# Patient Record
Sex: Female | Born: 1955
Health system: Southern US, Community
[De-identification: ages and names within clinical notes are randomized; demographics above are authoritative.]

## PROBLEM LIST (undated history)

## (undated) DIAGNOSIS — M1990 Unspecified osteoarthritis, unspecified site: Secondary | ICD-10-CM

## (undated) DIAGNOSIS — D649 Anemia, unspecified: Secondary | ICD-10-CM

## (undated) DIAGNOSIS — D571 Sickle-cell disease without crisis: Secondary | ICD-10-CM

## (undated) DIAGNOSIS — T7840XA Allergy, unspecified, initial encounter: Secondary | ICD-10-CM

## (undated) HISTORY — PX: ABDOMINAL HYSTERECTOMY: SHX81

## (undated) HISTORY — DX: Anemia, unspecified: D64.9

## (undated) HISTORY — PX: FOOT SURGERY: SHX648

## (undated) HISTORY — PX: HAND SURGERY: SHX662

## (undated) HISTORY — DX: Allergy, unspecified, initial encounter: T78.40XA

## (undated) HISTORY — DX: Sickle-cell disease without crisis: D57.1

## (undated) HISTORY — DX: Unspecified osteoarthritis, unspecified site: M19.90

---

## 1998-03-21 ENCOUNTER — Encounter: Admission: RE | Admit: 1998-03-21 | Discharge: 1998-03-21 | Payer: Self-pay | Admitting: Family Medicine

## 1998-05-01 ENCOUNTER — Encounter: Admission: RE | Admit: 1998-05-01 | Discharge: 1998-05-01 | Payer: Self-pay | Admitting: Family Medicine

## 1998-07-17 ENCOUNTER — Encounter: Admission: RE | Admit: 1998-07-17 | Discharge: 1998-07-17 | Payer: Self-pay | Admitting: Family Medicine

## 1998-08-25 ENCOUNTER — Encounter: Admission: RE | Admit: 1998-08-25 | Discharge: 1998-08-25 | Payer: Self-pay | Admitting: Family Medicine

## 1998-10-02 ENCOUNTER — Encounter: Admission: RE | Admit: 1998-10-02 | Discharge: 1998-10-02 | Payer: Self-pay | Admitting: Family Medicine

## 1998-10-22 ENCOUNTER — Encounter: Admission: RE | Admit: 1998-10-22 | Discharge: 1998-10-22 | Payer: Self-pay | Admitting: Family Medicine

## 1999-02-17 ENCOUNTER — Encounter: Admission: RE | Admit: 1999-02-17 | Discharge: 1999-02-17 | Payer: Self-pay | Admitting: Family Medicine

## 1999-03-23 ENCOUNTER — Encounter: Admission: RE | Admit: 1999-03-23 | Discharge: 1999-03-23 | Payer: Self-pay | Admitting: Family Medicine

## 1999-04-08 ENCOUNTER — Encounter: Admission: RE | Admit: 1999-04-08 | Discharge: 1999-04-08 | Payer: Self-pay | Admitting: Family Medicine

## 1999-05-12 ENCOUNTER — Encounter: Admission: RE | Admit: 1999-05-12 | Discharge: 1999-05-12 | Payer: Self-pay | Admitting: Sports Medicine

## 1999-05-20 ENCOUNTER — Inpatient Hospital Stay (HOSPITAL_COMMUNITY): Admission: RE | Admit: 1999-05-20 | Discharge: 1999-05-22 | Payer: Self-pay | Admitting: Gynecology

## 1999-05-20 ENCOUNTER — Encounter (INDEPENDENT_AMBULATORY_CARE_PROVIDER_SITE_OTHER): Payer: Self-pay | Admitting: Specialist

## 1999-11-09 ENCOUNTER — Encounter: Admission: RE | Admit: 1999-11-09 | Discharge: 1999-11-09 | Payer: Self-pay | Admitting: Family Medicine

## 2000-02-02 ENCOUNTER — Other Ambulatory Visit: Admission: RE | Admit: 2000-02-02 | Discharge: 2000-02-02 | Payer: Self-pay | Admitting: Gynecology

## 2000-03-16 ENCOUNTER — Encounter: Admission: RE | Admit: 2000-03-16 | Discharge: 2000-03-16 | Payer: Self-pay | Admitting: Family Medicine

## 2000-04-07 ENCOUNTER — Ambulatory Visit (HOSPITAL_COMMUNITY): Admission: RE | Admit: 2000-04-07 | Discharge: 2000-04-07 | Payer: Self-pay | Admitting: *Deleted

## 2000-04-08 ENCOUNTER — Encounter: Payer: Self-pay | Admitting: Family Medicine

## 2000-04-14 ENCOUNTER — Encounter: Admission: RE | Admit: 2000-04-14 | Discharge: 2000-04-14 | Payer: Self-pay | Admitting: Family Medicine

## 2000-04-15 ENCOUNTER — Encounter: Payer: Self-pay | Admitting: Family Medicine

## 2000-04-15 ENCOUNTER — Encounter: Admission: RE | Admit: 2000-04-15 | Discharge: 2000-04-15 | Payer: Self-pay | Admitting: *Deleted

## 2000-05-06 ENCOUNTER — Encounter (INDEPENDENT_AMBULATORY_CARE_PROVIDER_SITE_OTHER): Payer: Self-pay | Admitting: Specialist

## 2000-05-06 ENCOUNTER — Ambulatory Visit (HOSPITAL_COMMUNITY): Admission: RE | Admit: 2000-05-06 | Discharge: 2000-05-06 | Payer: Self-pay | Admitting: General Surgery

## 2000-05-06 ENCOUNTER — Encounter: Payer: Self-pay | Admitting: General Surgery

## 2000-09-30 ENCOUNTER — Encounter: Admission: RE | Admit: 2000-09-30 | Discharge: 2000-09-30 | Payer: Self-pay | Admitting: Family Medicine

## 2000-11-18 ENCOUNTER — Encounter: Payer: Self-pay | Admitting: General Surgery

## 2000-11-18 ENCOUNTER — Ambulatory Visit (HOSPITAL_COMMUNITY): Admission: RE | Admit: 2000-11-18 | Discharge: 2000-11-18 | Payer: Self-pay | Admitting: General Surgery

## 2001-03-07 ENCOUNTER — Encounter: Admission: RE | Admit: 2001-03-07 | Discharge: 2001-03-07 | Payer: Self-pay | Admitting: Family Medicine

## 2001-03-07 ENCOUNTER — Ambulatory Visit (HOSPITAL_COMMUNITY): Admission: RE | Admit: 2001-03-07 | Discharge: 2001-03-07 | Payer: Self-pay | Admitting: Family Medicine

## 2001-03-15 ENCOUNTER — Encounter: Admission: RE | Admit: 2001-03-15 | Discharge: 2001-03-15 | Payer: Self-pay | Admitting: Family Medicine

## 2001-03-23 ENCOUNTER — Encounter: Admission: RE | Admit: 2001-03-23 | Discharge: 2001-03-23 | Payer: Self-pay | Admitting: Family Medicine

## 2001-10-02 ENCOUNTER — Other Ambulatory Visit: Admission: RE | Admit: 2001-10-02 | Discharge: 2001-10-02 | Payer: Self-pay | Admitting: Gynecology

## 2001-11-14 ENCOUNTER — Encounter: Admission: RE | Admit: 2001-11-14 | Discharge: 2001-11-14 | Payer: Self-pay | Admitting: Sports Medicine

## 2001-12-08 ENCOUNTER — Encounter: Payer: Self-pay | Admitting: General Surgery

## 2001-12-08 ENCOUNTER — Ambulatory Visit (HOSPITAL_COMMUNITY): Admission: RE | Admit: 2001-12-08 | Discharge: 2001-12-08 | Payer: Self-pay | Admitting: General Surgery

## 2002-06-14 ENCOUNTER — Encounter: Admission: RE | Admit: 2002-06-14 | Discharge: 2002-06-14 | Payer: Self-pay | Admitting: Family Medicine

## 2002-07-24 ENCOUNTER — Encounter: Admission: RE | Admit: 2002-07-24 | Discharge: 2002-07-24 | Payer: Self-pay | Admitting: Family Medicine

## 2002-12-14 ENCOUNTER — Encounter: Admission: RE | Admit: 2002-12-14 | Discharge: 2002-12-14 | Payer: Self-pay | Admitting: Family Medicine

## 2002-12-17 ENCOUNTER — Encounter: Admission: RE | Admit: 2002-12-17 | Discharge: 2002-12-17 | Payer: Self-pay | Admitting: Family Medicine

## 2003-02-15 ENCOUNTER — Other Ambulatory Visit: Admission: RE | Admit: 2003-02-15 | Discharge: 2003-02-15 | Payer: Self-pay | Admitting: Gynecology

## 2003-04-19 ENCOUNTER — Encounter: Admission: RE | Admit: 2003-04-19 | Discharge: 2003-04-19 | Payer: Self-pay | Admitting: Family Medicine

## 2003-12-30 ENCOUNTER — Encounter: Admission: RE | Admit: 2003-12-30 | Discharge: 2003-12-30 | Payer: Self-pay | Admitting: Family Medicine

## 2004-01-17 ENCOUNTER — Encounter: Admission: RE | Admit: 2004-01-17 | Discharge: 2004-01-17 | Payer: Self-pay | Admitting: Gynecology

## 2004-01-20 ENCOUNTER — Encounter: Admission: RE | Admit: 2004-01-20 | Discharge: 2004-01-20 | Payer: Self-pay | Admitting: Family Medicine

## 2004-04-21 ENCOUNTER — Encounter: Admission: RE | Admit: 2004-04-21 | Discharge: 2004-04-21 | Payer: Self-pay | Admitting: Sports Medicine

## 2004-05-22 ENCOUNTER — Other Ambulatory Visit: Admission: RE | Admit: 2004-05-22 | Discharge: 2004-05-22 | Payer: Self-pay | Admitting: Gynecology

## 2004-11-05 ENCOUNTER — Ambulatory Visit (HOSPITAL_COMMUNITY): Admission: RE | Admit: 2004-11-05 | Discharge: 2004-11-05 | Payer: Self-pay | Admitting: Orthopedic Surgery

## 2004-11-05 ENCOUNTER — Ambulatory Visit (HOSPITAL_BASED_OUTPATIENT_CLINIC_OR_DEPARTMENT_OTHER): Admission: RE | Admit: 2004-11-05 | Discharge: 2004-11-05 | Payer: Self-pay | Admitting: Orthopedic Surgery

## 2005-12-20 ENCOUNTER — Ambulatory Visit: Payer: Self-pay | Admitting: Sports Medicine

## 2006-01-05 ENCOUNTER — Ambulatory Visit: Payer: Self-pay | Admitting: Family Medicine

## 2006-12-16 ENCOUNTER — Ambulatory Visit: Payer: Self-pay | Admitting: Family Medicine

## 2006-12-16 ENCOUNTER — Encounter (INDEPENDENT_AMBULATORY_CARE_PROVIDER_SITE_OTHER): Payer: Self-pay | Admitting: *Deleted

## 2006-12-16 LAB — CONVERTED CEMR LAB
AST: 14 units/L (ref 0–37)
Alkaline Phosphatase: 66 units/L (ref 39–117)
BUN: 10 mg/dL (ref 6–23)
CO2: 28 meq/L (ref 19–32)
Chloride: 105 meq/L (ref 96–112)
Creatinine, Ser: 0.77 mg/dL (ref 0.40–1.20)
Glucose, Bld: 84 mg/dL (ref 70–99)
LDL Cholesterol: 118 mg/dL — ABNORMAL HIGH (ref 0–99)
TSH: 2.084 microintl units/mL (ref 0.350–5.50)
Total CHOL/HDL Ratio: 4.6

## 2006-12-26 ENCOUNTER — Encounter: Admission: RE | Admit: 2006-12-26 | Discharge: 2006-12-26 | Payer: Self-pay | Admitting: Sports Medicine

## 2006-12-28 ENCOUNTER — Encounter: Admission: RE | Admit: 2006-12-28 | Discharge: 2006-12-28 | Payer: Self-pay | Admitting: Sports Medicine

## 2006-12-30 ENCOUNTER — Encounter (INDEPENDENT_AMBULATORY_CARE_PROVIDER_SITE_OTHER): Payer: Self-pay | Admitting: *Deleted

## 2006-12-30 LAB — CONVERTED CEMR LAB

## 2007-01-02 ENCOUNTER — Ambulatory Visit: Payer: Self-pay | Admitting: Sports Medicine

## 2007-01-02 ENCOUNTER — Encounter (INDEPENDENT_AMBULATORY_CARE_PROVIDER_SITE_OTHER): Payer: Self-pay | Admitting: *Deleted

## 2007-01-26 DIAGNOSIS — K219 Gastro-esophageal reflux disease without esophagitis: Secondary | ICD-10-CM | POA: Insufficient documentation

## 2007-01-26 DIAGNOSIS — E049 Nontoxic goiter, unspecified: Secondary | ICD-10-CM | POA: Insufficient documentation

## 2007-01-26 HISTORY — DX: Nontoxic goiter, unspecified: E04.9

## 2007-01-27 ENCOUNTER — Encounter (INDEPENDENT_AMBULATORY_CARE_PROVIDER_SITE_OTHER): Payer: Self-pay | Admitting: *Deleted

## 2007-01-30 ENCOUNTER — Ambulatory Visit: Payer: Self-pay | Admitting: Family Medicine

## 2007-02-27 ENCOUNTER — Telehealth (INDEPENDENT_AMBULATORY_CARE_PROVIDER_SITE_OTHER): Payer: Self-pay | Admitting: *Deleted

## 2007-03-23 ENCOUNTER — Telehealth: Payer: Self-pay | Admitting: *Deleted

## 2007-03-24 ENCOUNTER — Ambulatory Visit: Payer: Self-pay | Admitting: Family Medicine

## 2007-08-08 ENCOUNTER — Telehealth: Payer: Self-pay | Admitting: *Deleted

## 2007-08-15 ENCOUNTER — Telehealth (INDEPENDENT_AMBULATORY_CARE_PROVIDER_SITE_OTHER): Payer: Self-pay | Admitting: *Deleted

## 2008-02-16 ENCOUNTER — Telehealth: Payer: Self-pay | Admitting: *Deleted

## 2008-03-26 ENCOUNTER — Telehealth: Payer: Self-pay | Admitting: *Deleted

## 2008-03-26 ENCOUNTER — Ambulatory Visit: Payer: Self-pay | Admitting: Family Medicine

## 2008-03-26 DIAGNOSIS — J309 Allergic rhinitis, unspecified: Secondary | ICD-10-CM | POA: Insufficient documentation

## 2008-03-26 DIAGNOSIS — J302 Other seasonal allergic rhinitis: Secondary | ICD-10-CM | POA: Insufficient documentation

## 2009-01-31 ENCOUNTER — Ambulatory Visit: Payer: Self-pay | Admitting: Family Medicine

## 2009-06-19 ENCOUNTER — Ambulatory Visit: Payer: Self-pay | Admitting: Family Medicine

## 2009-06-19 ENCOUNTER — Encounter: Payer: Self-pay | Admitting: Family Medicine

## 2009-06-19 LAB — CONVERTED CEMR LAB
ALT: 18 units/L (ref 0–35)
AST: 21 units/L (ref 0–37)
Albumin: 3.9 g/dL (ref 3.5–5.2)
Alkaline Phosphatase: 75 units/L (ref 39–117)
Chloride: 107 meq/L (ref 96–112)
Creatinine, Ser: 0.84 mg/dL (ref 0.40–1.20)
MCHC: 34.3 g/dL (ref 30.0–36.0)
Platelets: 286 10*3/uL (ref 150–400)
Potassium: 4.8 meq/L (ref 3.5–5.3)
RDW: 14.4 % (ref 11.5–15.5)
Sodium: 143 meq/L (ref 135–145)
TSH: 1.325 microintl units/mL (ref 0.350–4.500)
Total Bilirubin: 0.5 mg/dL (ref 0.3–1.2)
Total Protein: 8.2 g/dL (ref 6.0–8.3)

## 2009-06-20 ENCOUNTER — Telehealth: Payer: Self-pay | Admitting: Family Medicine

## 2009-06-23 ENCOUNTER — Telehealth: Payer: Self-pay | Admitting: *Deleted

## 2010-10-01 ENCOUNTER — Encounter: Payer: Self-pay | Admitting: Family Medicine

## 2010-12-19 ENCOUNTER — Emergency Department (HOSPITAL_COMMUNITY)
Admission: EM | Admit: 2010-12-19 | Discharge: 2010-12-19 | Payer: Self-pay | Source: Home / Self Care | Admitting: Emergency Medicine

## 2010-12-30 ENCOUNTER — Encounter: Payer: Self-pay | Admitting: *Deleted

## 2010-12-31 NOTE — Miscellaneous (Signed)
  Clinical Lists Changes  Problems: Removed problem of SICKLE CELL TRAIT (ICD-282.5) Removed problem of MENOPAUSAL SYNDROME (ICD-627.2) Removed problem of FATIGUE, ACUTE (ICD-780.79) Removed problem of INSOMNIA NOS (ICD-780.52) Removed problem of GOITER NOS (ICD-240.9) Removed problem of ANKLE, PAINFUL ARTHRALGIA (ICD-719.47) Removed problem of ANEMIA, IRON DEFICIENCY, UNSPEC. (ICD-280.9) Removed problem of HERNIA, HIATAL, NONCONGENITAL (ICD-553.3) Removed problem of DEPRESSIVE DISORDER, NOS (ICD-311) Observations: Added new observation of PAST MED HX: G1P1 w/o complication Gerd Hypothyroidsim-no meds Seasonal Allergies Allergic Rhinitis Sickle Cell Trait Post menopausal h/o iron def anemia hiatal hernia h/o depression (10/01/2010 11:00)      Past Medical History:    G1P1 w/o complication    Gerd    Hypothyroidsim-no meds    Seasonal Allergies    Allergic Rhinitis    Sickle Cell Trait    Post menopausal    h/o iron def anemia    hiatal hernia    h/o depression

## 2011-02-16 ENCOUNTER — Ambulatory Visit: Payer: Self-pay

## 2011-02-17 ENCOUNTER — Emergency Department (HOSPITAL_COMMUNITY)
Admission: EM | Admit: 2011-02-17 | Discharge: 2011-02-17 | Disposition: A | Discharge status: Home / Self Care | Payer: Self-pay | Attending: Emergency Medicine | Admitting: Emergency Medicine

## 2011-02-17 ENCOUNTER — Emergency Department (HOSPITAL_COMMUNITY): Payer: Self-pay

## 2011-02-17 DIAGNOSIS — R509 Fever, unspecified: Secondary | ICD-10-CM | POA: Insufficient documentation

## 2011-02-17 DIAGNOSIS — H6690 Otitis media, unspecified, unspecified ear: Secondary | ICD-10-CM | POA: Insufficient documentation

## 2011-02-17 DIAGNOSIS — J029 Acute pharyngitis, unspecified: Secondary | ICD-10-CM | POA: Insufficient documentation

## 2011-02-17 DIAGNOSIS — R059 Cough, unspecified: Secondary | ICD-10-CM | POA: Insufficient documentation

## 2011-02-17 DIAGNOSIS — H9209 Otalgia, unspecified ear: Secondary | ICD-10-CM | POA: Insufficient documentation

## 2011-02-17 DIAGNOSIS — R079 Chest pain, unspecified: Secondary | ICD-10-CM | POA: Insufficient documentation

## 2011-02-17 DIAGNOSIS — K219 Gastro-esophageal reflux disease without esophagitis: Secondary | ICD-10-CM | POA: Insufficient documentation

## 2011-02-17 DIAGNOSIS — R05 Cough: Secondary | ICD-10-CM | POA: Insufficient documentation

## 2011-02-17 LAB — RAPID STREP SCREEN (MED CTR MEBANE ONLY): Streptococcus, Group A Screen (Direct): NEGATIVE

## 2011-04-16 NOTE — Op Note (Signed)
NAME:  [name], Moreno            ACCOUNT NO.:  [id number]   MEDICAL RECORD NO.:  [id number]          PATIENT TYPE:  AMB   LOCATION:  DSC                          FACILITY:  [facility]   PHYSICIAN:  Katy Fitch. Sypher Montez Hageman., M.D.DATE OF BIRTH:  Dec 13, 1955   DATE OF PROCEDURE:  11/05/2004  DATE OF DISCHARGE:                                 OPERATIVE REPORT   PREOPERATIVE DIAGNOSIS:  Painful bone on bone arthropathy right thumb carpal  metacarpal joint.   POSTOPERATIVE DIAGNOSIS:  Painful bone on bone arthropathy right thumb  carpal metacarpal joint.   OPERATION:  1.  Excision of right trapezium and distraction arthroplasty right thumb      carpal metacarpal joint.  2.  Free palmaris longus graft intermetacarpal ligament reconstruction      between thumb and index finger.   SURGEON:  Katy Fitch. Sypher, M.D.   ASSISTANT:  Marveen Reeks. Dasnoit, P.A.-C.   ANESTHESIA:  General by LMA.   ANESTHESIOLOGIST:  Janetta Hora. Gelene Mink, M.D.   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  Clinical examination revealed signs of bone on bone arthropathy of  her right thumb CMC joint.  She has exhausted all nonoperative measures  including splinting, activity modification, anti-inflammatory medication,  and analgesic medications.  Due to a failure to respond to nonoperative  measures, she is brought to the operating room at this time for  reconstruction of her right thumb CMC joint.  Preoperatively, a lengthy  informed consent was obtained.  We advised her of the potential risks and  benefits of the surgery including possible wound infection or pin tract  infection, possible pin breakage, and the occasional development of reflex  sympathetic dystrophy following these reconstructive hand procedures.  She  understands there is a small chance she will require anesthesia consultation  following  surgery for management of a regional pain syndrome and/or  dystrophy which could include the use of intravenous Clonidine blocks and/or  stellate ganglion blocks.  After questions were invited and answered, she is  brought to the operating room at this time.   PROCEDURE:  Morgan Moreno is brought to the operating room and placed on  supine position on the operating table.  Following induction of general  anesthesia by LMA, the right arm was prepped with Betadine solution and  sterilely draped.  Following exsanguination of the right arm with an Esmarch  bandage, the arterial tourniquet on the proximal brachium was inflated to  220 mmHg.  The procedure commenced with a curvilinear incision exposing the  region of the carpal metacarpal joint directly over the extensor pollicus  brevis and abductor pollicus longus tendons.  The CMC capsule was identified  through a longitudinal incision and the trapezium exposed subperiosteally  utilizing a 64 beaver blade and fine osteotome.  The trapezium was  morselized and removed completely in a piecemeal manner.  A complete  synovectomy of the [facility] joint was accomplished with removal of loose bodies  and other debris.   Drill holes were created through the  base of the index metacarpal and thumb  metacarpal and enlarged to 3.6 mm with sequential hand drilling.  The drill  hole through the base of the thumb metacarpal was oblique at a 45 degree  angle beginning 1 cm distal on the dorsal surface of the metacarpal proximal  metaphysis and extending to the central portion of the articular surface.  The palmaris longus was then harvested through a short transverse incision  over the flexion crease followed by clearing of all muscle tissue and  storing in a saline soaked sponge.  The palmaris longus was woven deep to  the extensor carpi radialis brevis up the dorsal aspect of its insertion on  the base of the index metacarpal and subsequently passed through  the drill  hole in the index metacarpal from its dorsal ulnar origin to its radial  palmar exit.  This was then tensioned appropriately and the tails of the  palmaris longus were then passed through the base of the thumb metacarpal  from the proximal articular surface to the dorsal exit point.  These were  then brought beneath the thumb metacarpal, woven around itself where it  exited to create an intrametacarpal interposition of tendon and subsequently  repaired with multiple mattress sutures of 3-0 Ethibond.  The free tails  were then wrapped around the flexor carpi radialis and tied with an overhand  knot and secured with mattress suture of 3-0 Ethibond.  A very satisfactory  suspension of the thumb was achieved followed by use of a 0.062 inch  Kirschner wire intramedullary through the thumb metacarpal from distal to  proximal which was then placed in the trapezoid and across into the capitate  to obtain a satisfactory distraction arthroplasty.  The wound was irrigated,  bleeding points were electrocauterized followed by release of the  tourniquet.  The wounds were repaired with intradermal 3-0 Prolene and Steri-  Strips.  There were no apparent complications.  Ms. Baugh was noted to  have minimal hyperextension of the MP joint prior to pin placement.  The MP  joint will be locked in approximately 25o degrees flexion for a six week  period.  There were no apparent complications.  For aftercare, Ms. Komatsu  is placed in a voluminous gauze dressing with dorsal and palmar plaster  sandwich splints maintaining the thumb in a palmar abducted position.  She  will be admitted to [facility] for observation of her vital  signs and appropriate analgesics in the form of IV PCA morphine, p.o. and IV  Dilaudid.  We will give her Ancef 1 gram IV q.8h. x 3 doses as a  prophylactic antibiotic.      Robe  RVS/MEDQ  D:  11/05/2004  T:  11/05/2004  Job:  [id number]

## 2013-02-23 ENCOUNTER — Ambulatory Visit: Payer: Self-pay | Admitting: Family Medicine

## 2013-02-23 ENCOUNTER — Ambulatory Visit (INDEPENDENT_AMBULATORY_CARE_PROVIDER_SITE_OTHER): Payer: BC Managed Care – PPO | Admitting: Family Medicine

## 2013-02-23 ENCOUNTER — Encounter: Payer: Self-pay | Admitting: Family Medicine

## 2013-02-23 VITALS — BP 120/84 | HR 89 | Ht 66.0 in | Wt 178.0 lb

## 2013-02-23 DIAGNOSIS — D649 Anemia, unspecified: Secondary | ICD-10-CM

## 2013-02-23 DIAGNOSIS — E039 Hypothyroidism, unspecified: Secondary | ICD-10-CM

## 2013-02-23 DIAGNOSIS — Z23 Encounter for immunization: Secondary | ICD-10-CM

## 2013-02-23 DIAGNOSIS — Z0000 Encounter for general adult medical examination without abnormal findings: Secondary | ICD-10-CM

## 2013-02-23 LAB — COMPREHENSIVE METABOLIC PANEL
ALT: 9 U/L (ref 0–35)
BUN: 10 mg/dL (ref 6–23)
CO2: 28 mEq/L (ref 19–32)
Calcium: 8.9 mg/dL (ref 8.4–10.5)
Chloride: 106 mEq/L (ref 96–112)
Glucose, Bld: 135 mg/dL — ABNORMAL HIGH (ref 70–99)

## 2013-02-23 LAB — TSH: TSH: 0.842 u[IU]/mL (ref 0.350–4.500)

## 2013-02-23 LAB — CBC
MCH: 26.7 pg (ref 26.0–34.0)
RBC: 4.3 MIL/uL (ref 3.87–5.11)
RDW: 15 % (ref 11.5–15.5)
WBC: 7.7 10*3/uL (ref 4.0–10.5)

## 2013-02-23 MED ORDER — TETANUS-DIPHTH-ACELL PERTUSSIS 5-2.5-18.5 LF-MCG/0.5 IM SUSP
0.5000 mL | Freq: Once | INTRAMUSCULAR | Status: DC
Start: 2013-02-23 — End: 2013-02-23

## 2013-02-23 NOTE — Assessment & Plan Note (Signed)
Several issues discussed. See other problem list notes for other details. -Mammogram - last one several years ago  -provided with information to contact breast imaging center -Pap smear - last done several years ago, normal to pt's knowledge  -pt to schedule an appointment specifically for this and to discuss colonoscopy -Colonoscopy - risks/benefits discussed; alternative of stool cards also discussed  -pt will think about it and plan to rediscuss at f/u for pap smear -Tdap - last taken ~5-6 years ago; given today -Depression - uncertain diagnosis; some typical symptoms (anhedonia/subjective depression, sleep difficulties, etc), no SI/HI  -symptoms could also be explained by hypothyroidism and/or anemia, so will hold off on starting any medications at this point

## 2013-02-23 NOTE — Assessment & Plan Note (Signed)
Per pt history; she has been told in the past that she has sickle trait, but she is unsure of this diagnosis. Has not had bloodwork in several years. Anemia could certainly be causing some of her symptoms, in addition to likely hypothyroidism and/or possible depression. Will check CBC and consider further work-up/treatment.

## 2013-02-23 NOTE — Patient Instructions (Signed)
Thank you for coming in, today! It was nice to meet you. I think a lot of your symptoms could be explained by hypothyroidism.  Thyroid hormone is responsible for a lot of things in the body (including energy level, sleep, and appetite)  We will check a TSH today (the hormone that tells the thyroid gland what to do).  You could also be anemic, so we will check that as well.  Depending on your lab results, I might start or adjust some medications for you. I will call you or send you a letter with the results of your labs. I think depression could also be causing some of your symptoms. We will see what your blood work looks like before I think about starting any medication for depression, though. We will give you a Tdap vaccine today. We will give you information on getting your mammogram done. Please make an appointment today to come back for a Pap smear and to talk about colonoscopy. Please feel free to call with questions or concerns, at any time. --Dr. Casper Harrison

## 2013-02-23 NOTE — Progress Notes (Signed)
Subjective:    Patient ID: Morgan Moreno, female    DOB: Jul 11, 1956, 57 y.o.   MRN: [id number]  HPI: Pt presents to clinic for general wellness visit, to re-establish care. Pt identifies several chronic/subacute symptoms and previous diagnosis of hypothyroidism as concerns, as well as general health-maintenance concerns. Pt questions whether she may be depressed, as well.  Hypothyroidism - Pt told in the past that she had hypothyroidism, with Synthroid managed by her previous FM doctors and "another doctor" (unsure of name/specialty); pt describes some discordance between providers in terms of advice she was given, so she has not been on Synthroid for "a few years." Pt also states she was told "once you start Synthroid you take it for the rest of your life" and is hesitant about medications. Pt describes numerous symptoms that she believes may be related to hyperthyroidism that have been present for "years," including a feeling that her thyroid is "large" and daily fatigue and low energy. Pt describes constipation as an "almost constant problem" (i.e., weekly). Pt also describes body aches, "all over" worse for the past 2-3 months. Pt denies skin/hair changes, weight gain, easy bleeding/bruising.  Anemia - Pt states she has had anemia in the past and wonders if she could be anemic now; has not had bloodwork in "a while." Does not recall specific treatments. States she has been told in the past that she has sickle trait, but she is unsure of this. Pt has had a hysterectomy; this was for "tons of fibroids" and bleeding, which she thinks caused/contributed to her anemia.  ?Depression - symptoms as above. Pt also describes trouble falling asleep and difficulty staying asleep (waking up at 2 am and unable to get back to sleep is a very frustrating symptom for her); she does state that she has a TV in her bedroom. PHQ9 score 9 today (1 point each for little interest in things and feeling down/depressed; 4  points for sleep difficulties; 1 point for poor appetite; 1 point for "feeling bad about oneself"' 1 point for trouble concentrating). Symptoms are distressing but pt states she is able to function at home and at work. Pt does state, in terms of lack of interest, that some days she feels fine and some days she "does well to get out of bed." Never on antidepressants in the past. Denies SI/HI.  Health maintenance: -Pap smear - last done "several years ago," normal to pt's knowledge. Requests specific visit for overdue pap. -Mammogram - last done ~5 years ago. Normal to pt's knowledge. -Colonoscopy - never done. Pt states she would like to think about it before being referred to GI. -Tdap - last taken about 5-6 years ago  SH, FH, and PMH/surgical history reviewed and updated as appropriate in relevant sections of EMR. Pt is a never smoker and only very rarely drinks alcohol. Meds: Prilosec, Claritin Allergies reviewed.  Review of Systems: As above. Otherwise generally feels well. No cough/congestion other than occasional seasonal allergies. No fever/chills, recent sick contacts, abdominal pain or chest pain, N/V, diarrhea (endorses constipation, as above).     Objective:   Physical Exam BP 120/84  Pulse 89  Ht 5\' 6"  (1.676 m)  Wt 178 lb (80.74 kg)  BMI 28.74 kg/m2 Gen: well-appearing adult female HEENT: [place]/AT, TMs clear bilaterally, neck with full ROM, supple  MMM, conjunctivae and sclerae clear, posterior oropharynx clear  Thyroid palpably enlarged but not firm/fixed, no definite nodules appreciated Neuro: no gross focal deficits; gait intact; strength 5/5 in  all four extremities Psych: slightly depressed mood but reactive/normal affect; pt appears generally euthymic and smiles/laughs during exam/conversation; thought process linear, content normal; not acutely psychotic Cardio: RRR, no murmur appreciated Pulm: CTAB, no wheezes Skin: clear, no rashes Ext: moves all extremities equally;  warm/well-perfused, no clubbing/cyanosis/edema     Assessment & Plan:

## 2013-02-23 NOTE — Assessment & Plan Note (Signed)
A: Several symptoms described that are consistent with clinical hypothyroidism (see HPI). Pt also with palpably enlarged thyroid on exam; no definite nodules appreciated. DDx for symptoms also includes anemia and depression (see other problem list notes).  P: Will check TSH today. Anticipate restarting Synthroid. Depending on results of labwork done today, may also consider starting iron for anemia (and/or further work-up for underlying cause) and/or antidepressant medication.

## 2013-02-26 ENCOUNTER — Telehealth: Payer: Self-pay | Admitting: Family Medicine

## 2013-02-26 NOTE — Telephone Encounter (Signed)
Attempted to call pt with regards to recent labwork done for hx of hypothyroidism and weakness/tiredness/etc (see my clinic note from 3/28). TSH is within the normal range, so I don't necessarily want to restart Synthroid, at this point; this could be an option, though. Her hemoglobin is very slightly low, but not severely enough to make me think it is the main cause of her symptoms. We had discussed the possibility of starting an iron supplement for anemia, if she has any, and/or potentially starting an antidepressant medication. I think she can start an iron supplement over the counter, regardless. We can discuss whether or not she would like to try an antidepressant, and then follow up as needed to titrate or try different agents, or do more tests, if necessary.  Left message on pt's machine instructing her to call back for details of "recent labs and to discuss possibly starting medication, either on the phone or as a clinic visit with me," per her preference. No need to call pt back today. If/when she calls, the above can be relayed to her. If she would like to speak with me on the phone, I can plan to call her back at a later time. If she would like to come in for a clinic visit with me, she can schedule a visit at her convenience. Thanks! --CMS

## 2013-02-28 NOTE — Telephone Encounter (Signed)
Pt called back and message was relayed - she will call back if she has any other questions.

## 2014-01-30 ENCOUNTER — Other Ambulatory Visit: Payer: Self-pay | Admitting: *Deleted

## 2014-01-30 DIAGNOSIS — H269 Unspecified cataract: Secondary | ICD-10-CM

## 2014-02-05 HISTORY — PX: CATARACT EXTRACTION: SUR2

## 2014-03-08 ENCOUNTER — Ambulatory Visit (INDEPENDENT_AMBULATORY_CARE_PROVIDER_SITE_OTHER): Payer: 59 | Admitting: Family Medicine

## 2014-03-08 ENCOUNTER — Encounter: Payer: Self-pay | Admitting: Family Medicine

## 2014-03-08 VITALS — BP 137/80 | HR 80 | Temp 98.6°F | Ht 66.0 in | Wt 169.0 lb

## 2014-03-08 DIAGNOSIS — R5381 Other malaise: Secondary | ICD-10-CM | POA: Insufficient documentation

## 2014-03-08 DIAGNOSIS — R0683 Snoring: Secondary | ICD-10-CM | POA: Insufficient documentation

## 2014-03-08 DIAGNOSIS — R0609 Other forms of dyspnea: Secondary | ICD-10-CM

## 2014-03-08 DIAGNOSIS — R5383 Other fatigue: Secondary | ICD-10-CM

## 2014-03-08 DIAGNOSIS — D649 Anemia, unspecified: Secondary | ICD-10-CM

## 2014-03-08 DIAGNOSIS — R0989 Other specified symptoms and signs involving the circulatory and respiratory systems: Secondary | ICD-10-CM

## 2014-03-08 DIAGNOSIS — E039 Hypothyroidism, unspecified: Secondary | ICD-10-CM

## 2014-03-08 DIAGNOSIS — G4700 Insomnia, unspecified: Secondary | ICD-10-CM | POA: Insufficient documentation

## 2014-03-08 LAB — CBC WITH DIFFERENTIAL/PLATELET
BASOS ABS: 0 10*3/uL (ref 0.0–0.1)
Basophils Relative: 0 % (ref 0–1)
EOS ABS: 0 10*3/uL (ref 0.0–0.7)
EOS PCT: 0 % (ref 0–5)
HCT: 36.6 % (ref 36.0–46.0)
HEMOGLOBIN: 12.7 g/dL (ref 12.0–15.0)
Lymphocytes Relative: 15 % (ref 12–46)
Lymphs Abs: 1.1 10*3/uL (ref 0.7–4.0)
MCH: 27.6 pg (ref 26.0–34.0)
MCHC: 34.7 g/dL (ref 30.0–36.0)
MCV: 79.6 fL (ref 78.0–100.0)
MONO ABS: 0.2 10*3/uL (ref 0.1–1.0)
MONOS PCT: 3 % (ref 3–12)
NEUTROS ABS: 6.2 10*3/uL (ref 1.7–7.7)
NEUTROS PCT: 82 % — AB (ref 43–77)
Platelets: 336 10*3/uL (ref 150–400)
RBC: 4.6 MIL/uL (ref 3.87–5.11)
RDW: 15.4 % (ref 11.5–15.5)
WBC: 7.5 10*3/uL (ref 4.0–10.5)

## 2014-03-08 NOTE — Progress Notes (Signed)
   Subjective:    Patient ID: Morgan Moreno, female    DOB: 15-Sep-1956, 58 y.o.   MRN: [id number]  HPI: Pt presents to clinic with complaint of being "tired" all the time. Last seen about 1 year ago. - last visit discussed:  - hypothyroidism (TSH was normal so deferred restarting Synthroid)  - mild anemia (has not followed up since then, did not start iron but did start a regular vitamin, which hasn't helped). - currently describes poor sleep in general, 6 or fewer hours per night (going on for years) - has some help with Zzzquil, 1-2 times per week or less - pt is unsure if she snores but thinks she does; denies frank PND - reports she drinks very little caffeine (one caffeinated drink in half a year's time or more) - does have a TV in the bedroom but tries not to watch it right before bed - denies significant psychosocial stressors and generally feels well - reports she is taking online classes for human services masters, which is almost done  Of note, pt recently had cataract surgery and is on oral prednisone with 3 types of eyedrops; believes prednisone may be playing some role in her insomnia.  Review of Systems: As above. PHQ-2 negative. Denies frequent urination or increased nocturia.      Objective:   Physical Exam BP 137/80  Pulse 80  Temp(Src) 98.6 F (37 C) (Oral)  Ht 5\' 6"  (1.676 m)  Wt 169 lb (76.658 kg)  BMI 27.29 kg/m2 Gen: well-appearing adult female in NAD HEENT: [place]/AT, EOMI, TM's clear  MMM, no pallor of MM or conjunctivae or gingivae  Thyroid nontender / not enlarged Cardio: RRR, no murmur appreciated Pulm: CTAB, no wheezes, normal WOB Abd: soft, nontender, BS+ Skin: warm, dry, intact, without rash or pallor Ext: right wrist with brace for comfort; otherwise warm, well-perfused, no LE edema     Assessment & Plan:

## 2014-03-08 NOTE — Assessment & Plan Note (Signed)
Raises concern for apneic process during sleep, though pt denies PND and doesn't think she snores 'badly.' Referred for sleep study, today. Will f/u as appropriate.

## 2014-03-08 NOTE — Assessment & Plan Note (Signed)
Likely a large component of daytime sleepiness / fatigue, in context of snoring and with hypothyroidism hx and anemia. See other problem list notes; labs drawn today and referred for sleep study.

## 2014-03-08 NOTE — Assessment & Plan Note (Signed)
Main focus of today's visit, likely multifactorial, given hx of hypothyroidism and anemia with current insomnia and questionable sleep hygiene, in context of snoring / daytime sleepiness. Plan to get sleep study, then f/u as needed; see also hypothyroidism and anemia problem list notes (labs drawn today).

## 2014-03-08 NOTE — Assessment & Plan Note (Signed)
Pt believes she has been told she has sickle trait in the past, but is unsure of this diagnosis. Hb mildly low on last check, rechecked, today; pt has started a multivitamin daily in the interim, but not an iron supplement. Will check CBC, iron and ferritin, B12, folate today, in addition to TSH, and follow up results as appropriate. Possible contribution of anemia to fatigue; see also other problem list notes.

## 2014-03-08 NOTE — Patient Instructions (Addendum)
Thank you for coming in, today!  I think your fatigue might be related to several things. We'll check your blood for iron, vitamins, and hemoglobin levels as well as your thyroid function. I'll call you or send you a letter with the results and let you know what needs to be done, if anything.  I'll also order a sleep study today. The sleep study lab will call you to schedule an appointment. If you haven't heard anything in a week or two, call back and we'll see what else (if anything) needs to be done.  Otherwise, you can plan to come back to see me as you need. If you need to come back at a specific time because of any results, I'll let you know. Please feel free to call with any questions or concerns at any time, at [phone]. --Dr. Casper HarrisonStreet

## 2014-03-08 NOTE — Assessment & Plan Note (Signed)
Per history, with previous hx of being on Synthroid, though last TSH was normal. Thyroid enlarged last visit but not as appreciated, today. Will recheck TSH, today; even if remains in normal range, will consider starting low-dose Synthroid, given constellation of symptoms from last visit and fatigue currently.

## 2014-03-09 LAB — FERRITIN: FERRITIN: 120 ng/mL (ref 10–291)

## 2014-03-09 LAB — IBC PANEL
%SAT: 11 % — ABNORMAL LOW (ref 20–55)
TIBC: 317 ug/dL (ref 250–470)
UIBC: 281 ug/dL (ref 125–400)

## 2014-03-09 LAB — TSH: TSH: 0.343 u[IU]/mL — AB (ref 0.350–4.500)

## 2014-03-09 LAB — IRON: Iron: 36 ug/dL — ABNORMAL LOW (ref 42–145)

## 2014-03-09 LAB — FOLATE: Folate: >20 ng/mL

## 2014-03-09 LAB — VITAMIN B12: VITAMIN B 12: 760 pg/mL (ref 211–911)

## 2014-03-11 ENCOUNTER — Telehealth: Payer: Self-pay | Admitting: Family Medicine

## 2014-03-11 MED ORDER — FERROUS GLUCONATE 324 (38 FE) MG PO TABS: 324.0000 mg | ORAL_TABLET | Freq: Two times a day (BID) | ORAL | Status: DC

## 2014-03-11 NOTE — Telephone Encounter (Signed)
Called pt to discuss recent results. Left message; pt's iron is slightly low so will prescribe a BID supplement (e-script sent in, today). Recommended Colace or Miralax over the counter for any constipation. TSH also slightly low, but will recheck in a few months before deciding if further work-up needs to be done. Also reiterated f/u with sleep study and instructed pt to call if she has not heard from scheduling in the next 1-2 weeks. Also recommended pt call with any questions. --CMS

## 2015-01-27 ENCOUNTER — Telehealth: Payer: Self-pay | Admitting: *Deleted

## 2015-01-27 NOTE — Telephone Encounter (Signed)
[facility] called needing a referral placed. [facility] has [facility]; appt 01/31/15 at 8:15 AM.  Pt will be seeing Dr. Earlene Plateravis NPI [id number]; ICD-10 code H40.013.  Please fax once referral is placed [phone].  Clovis PuMartin, Tamika L, RN

## 2015-05-23 ENCOUNTER — Telehealth: Payer: Self-pay | Admitting: *Deleted

## 2015-05-23 NOTE — Telephone Encounter (Signed)
Called patient and informed that she was overdue for mammogram, gave her the number to [facility] to schedule.

## 2017-02-11 ENCOUNTER — Encounter: Payer: Self-pay | Admitting: Family Medicine

## 2017-02-11 ENCOUNTER — Ambulatory Visit (INDEPENDENT_AMBULATORY_CARE_PROVIDER_SITE_OTHER): Payer: BLUE CROSS/BLUE SHIELD | Admitting: Family Medicine

## 2017-02-11 VITALS — BP 140/88 | HR 84 | Temp 97.9°F | Ht 66.0 in | Wt 169.0 lb

## 2017-02-11 DIAGNOSIS — T192XXA Foreign body in vulva and vagina, initial encounter: Secondary | ICD-10-CM | POA: Insufficient documentation

## 2017-02-11 DIAGNOSIS — R5383 Other fatigue: Secondary | ICD-10-CM

## 2017-02-11 DIAGNOSIS — Z1159 Encounter for screening for other viral diseases: Secondary | ICD-10-CM

## 2017-02-11 MED ORDER — METRONIDAZOLE 500 MG PO TABS: 500.0000 mg | ORAL_TABLET | Freq: Two times a day (BID) | ORAL | 0 refills | Status: DC

## 2017-02-11 MED ORDER — CIPROFLOXACIN HCL 500 MG PO TABS: 500.0000 mg | ORAL_TABLET | Freq: Two times a day (BID) | ORAL | 0 refills | Status: DC

## 2017-02-11 NOTE — Assessment & Plan Note (Signed)
Patient's TSH was noted to be low in 2015. She was supposed to follow-up in our clinic, unfortunately never did.  Diffuse goiter noted in exam, the right lobe more prominent than the left. Goiter is nontender. -Future lab for TSH, free T4, free T3. -Pending these findings, may need ultrasound versus radioiodine uptake.

## 2017-02-11 NOTE — Patient Instructions (Signed)
It was nice to meet you. I have started you on 2 antibiotics. Do not drink while on the Flagyl. Please make a lab appointment when it is convenient for you. Please follow-up in my clinic in 2 weeks so we can repeat your vaginal exam and follow-up on your fatigue.

## 2017-02-11 NOTE — Assessment & Plan Note (Signed)
Most likely from hypo-thyroidism that is untreated, but could also be secondary to anemia and insomnia.  -We will get a future CBC. -We'll get thyroid function panel as stated previously. -May need a sleep study eventually pending other interventions.

## 2017-02-11 NOTE — Assessment & Plan Note (Signed)
On my examination, noted what appeared to be a foreign object. The patient states that she had hormonal rings placed every 3 months. She states that she would place the rings once every 3 months and remove the other. She states initially when she believed there was only 1 ring, that she thought that the gynecologist removed it on her last examination (she cannot recall). However, I ultimately removed 4 rings (appear to be Estrings given her history and exam). She cannot tell me how long these rings have been there. I suspect this is the reason for her intermittent vaginal bleeding.  - started on ciprofloxacin/flagyl x 14 days - pt to f/u with me in 2 weeks for repeat speculum exam. At that time I will get a pap smear as it shows she had endocervical cells on her previous pap smear in 2008 which does not correlate with her story.

## 2017-02-11 NOTE — Progress Notes (Signed)
Subjective: Morgan Moreno  HPI: Patient is a 61 y.o. female with a past medical history of anemia, hypothyroidism, and insomnia presenting to clinic today for fatigue.  She was last seen in 2015 for this but never followed up.  She endorses intermittently fatigue. Worsened over the last 3 weeks.  It has been years since she was on Synthroid. Sleeping is poor intermittently, sometimes has difficulty falling asleep, sometimes staying asleep. Sometimes uses melatonin which helps. No PND or orthopnea Not taking iron She noted a goiter that's slowly gotten larger over a year.   Eating is stable, has reflux but is controlled by diet. She's having constipation. She's having infrequent stools; BMs once per week. Not straining for BM b/c she's using a probiotic; stool is semi-solid.  Her skin is a little dry, but stable. No hot or cold intolerance.   Vaginal bleeding: Pt has had a complete hysterectomy. Notes light bleeding on the toilet paper. Never heavy like a period. No intercourse in long time. Mild vaginal pruritus, no discharge, no foul odor. Cannot recall the last time she saw gynecology.  Social History: non-smoker  ROS: All other systems reviewed and are negative.  Past Medical History Patient Active Problem List   Diagnosis Date Noted  . Vaginal foreign object, initial encounter 02/11/2017  . Other malaise and fatigue 03/08/2014  . Insomnia 03/08/2014  . Snoring 03/08/2014  . Anemia, unspecified 02/23/2013  . Health care maintenance 02/23/2013  . ALLERGIC RHINITIS 03/26/2008  . HYPOTHYROIDISM, UNSPECIFIED 01/26/2007  . GASTROESOPHAGEAL REFLUX, NO ESOPHAGITIS 01/26/2007    Medications- reviewed and updated Current Outpatient Prescriptions  Medication Sig Dispense Refill  . ciprofloxacin (CIPRO) 500 MG tablet Take 1 tablet (500 mg total) by mouth 2 (two) times daily. 14 tablet 0  . loratadine (CLARITIN) 10 MG tablet Take 10 mg by mouth daily.      . metroNIDAZOLE  (FLAGYL) 500 MG tablet Take 1 tablet (500 mg total) by mouth 2 (two) times daily. 14 tablet 0  . omeprazole (PRILOSEC) 20 MG capsule Take 20 mg by mouth daily.       No current facility-administered medications for this visit.     Objective: Office vital signs reviewed. BP 140/88   Pulse 84   Temp 97.9 F (36.6 C) (Oral)   Ht 5\' 6"  (1.676 m)   Wt 169 lb (76.7 kg)   BMI 27.28 kg/m    Physical Examination:  General: Awake, alert, well- nourished, NAD ENMT:  TMs intact, normal light reflex, no erythema, no bulging. Nasal turbinates moist. MMM, Oropharynx clear without erythema or tonsillar exudate/hypertrophy Eyes: Conjunctiva non-injected. PERRL.  Neck: Diffusely enlarged goiter, R>L. No specific nodules noted. Non-tender. Cardio: RRR, no m/r/g noted.  Pulm: No increased WOB.  CTAB, without wheezes, rhonchi or crackles noted.  GU: GYN:  External genitalia within normal limits. Malodorous. On insertion of speculum noted what appeared to be a foreign object. On bimanual exam objects felt to be plastic and tube like. I was able to remove 4 very brown/black plastic rings from the vaginal vault, cover in yellow/brown discharge admixed with some blood.      Labs from 2015: Hg 12.7, iron 36, TIBC 11, ferritin 120, TSH 0.343  Assessment/Plan: HYPOTHYROIDISM, UNSPECIFIED Patient's TSH was noted to be low in 2015. She was supposed to follow-up in our clinic, unfortunately never did.  Diffuse goiter noted in exam, the right lobe more prominent than the left. Goiter is nontender. -Future lab for TSH, free T4, free T3. -  Pending these findings, may need ultrasound versus radioiodine uptake.  Other malaise and fatigue Most likely from hypo-thyroidism that is untreated, but could also be secondary to anemia and insomnia.  -We will get a future CBC. -We'll get thyroid function panel as stated previously. -May need a sleep study eventually pending other interventions.  Vaginal foreign object,  initial encounter On my examination, noted what appeared to be a foreign object. The patient states that she had hormonal rings placed every 3 months. She states that she would place the rings once every 3 months and remove the other. She states initially when she believed there was only 1 ring, that she thought that the gynecologist removed it on her last examination (she cannot recall). However, I ultimately removed 4 rings (appear to be Estrings given her history and exam). She cannot tell me how long these rings have been there. I suspect this is the reason for her intermittent vaginal bleeding.  - started on ciprofloxacin/flagyl x 14 days - pt to f/u with me in 2 weeks for repeat speculum exam. At that time I will get a pap smear as it shows she had endocervical cells on her previous pap smear in 2008 which does not correlate with her story.    Orders Placed This Encounter  Procedures  . HIV antibody    Standing Status:   Future    Standing Expiration Date:   02/11/2018  . Hepatitis C antibody    Standing Status:   Future    Standing Expiration Date:   02/11/2018  . TSH    Standing Status:   Future    Standing Expiration Date:   02/11/2018  . T3, free    Standing Status:   Future    Standing Expiration Date:   02/11/2018  . T4, free    Standing Status:   Future    Standing Expiration Date:   02/11/2018  . CBC    Standing Status:   Future    Standing Expiration Date:   02/11/2018    Meds ordered this encounter  Medications  . ciprofloxacin (CIPRO) 500 MG tablet    Sig: Take 1 tablet (500 mg total) by mouth 2 (two) times daily.    Dispense:  14 tablet    Refill:  0  . metroNIDAZOLE (FLAGYL) 500 MG tablet    Sig: Take 1 tablet (500 mg total) by mouth 2 (two) times daily.    Dispense:  14 tablet    Refill:  0    Joanna Puffrystal S. Victorhugo Preis PGY-3, [address] [facility]

## 2017-02-18 ENCOUNTER — Other Ambulatory Visit: Payer: BLUE CROSS/BLUE SHIELD

## 2017-02-18 DIAGNOSIS — R5383 Other fatigue: Secondary | ICD-10-CM

## 2017-02-18 DIAGNOSIS — Z1159 Encounter for screening for other viral diseases: Secondary | ICD-10-CM

## 2017-02-19 LAB — CBC
Hematocrit: 34.4 % (ref 34.0–46.6)
Hemoglobin: 11.6 g/dL (ref 11.1–15.9)
MCH: 27.7 pg (ref 26.6–33.0)
MCHC: 33.7 g/dL (ref 31.5–35.7)
MCV: 82 fL (ref 79–97)
Platelets: 284 10*3/uL (ref 150–379)
RBC: 4.19 x10E6/uL (ref 3.77–5.28)
RDW: 14.7 % (ref 12.3–15.4)
WBC: 5.9 10*3/uL (ref 3.4–10.8)

## 2017-02-19 LAB — HIV ANTIBODY (ROUTINE TESTING W REFLEX): HIV SCREEN 4TH GENERATION: NONREACTIVE

## 2017-02-19 LAB — TSH: TSH: 0.916 u[IU]/mL (ref 0.450–4.500)

## 2017-02-19 LAB — T3, FREE: T3 FREE: 2.7 pg/mL (ref 2.0–4.4)

## 2017-02-19 LAB — T4, FREE: FREE T4: 1.07 ng/dL (ref 0.82–1.77)

## 2017-02-19 LAB — HEPATITIS C ANTIBODY: Hep C Virus Ab: 0.1 s/co ratio (ref 0.0–0.9)

## 2017-02-28 ENCOUNTER — Telehealth: Payer: Self-pay | Admitting: Family Medicine

## 2017-02-28 NOTE — Telephone Encounter (Signed)
Attempted to call patient with thyroid function results. No answer.  Pt is scheduled with me later this week. She will need a soft tissue head/neck ultrasound to further evaluate her goiter. Will discuss with her at that time.   Joanna Puff, MD [facility] Resident  02/28/2017, 9:07 AM

## 2017-03-04 ENCOUNTER — Ambulatory Visit (INDEPENDENT_AMBULATORY_CARE_PROVIDER_SITE_OTHER): Payer: BLUE CROSS/BLUE SHIELD | Admitting: Family Medicine

## 2017-03-04 ENCOUNTER — Other Ambulatory Visit (HOSPITAL_COMMUNITY)
Admission: RE | Admit: 2017-03-04 | Discharge: 2017-03-04 | Disposition: A | Discharge status: Home / Self Care | Payer: BLUE CROSS/BLUE SHIELD | Source: Ambulatory Visit | Attending: Family Medicine | Admitting: Family Medicine

## 2017-03-04 ENCOUNTER — Ambulatory Visit (HOSPITAL_COMMUNITY)
Admission: RE | Admit: 2017-03-04 | Discharge: 2017-03-04 | Disposition: A | Discharge status: Home / Self Care | Payer: BLUE CROSS/BLUE SHIELD | Source: Ambulatory Visit | Attending: Family Medicine | Admitting: Family Medicine

## 2017-03-04 ENCOUNTER — Encounter: Payer: Self-pay | Admitting: Family Medicine

## 2017-03-04 VITALS — BP 136/80 | HR 101 | Temp 98.1°F | Ht 66.0 in | Wt 169.2 lb

## 2017-03-04 DIAGNOSIS — E049 Nontoxic goiter, unspecified: Secondary | ICD-10-CM | POA: Diagnosis not present

## 2017-03-04 DIAGNOSIS — N898 Other specified noninflammatory disorders of vagina: Secondary | ICD-10-CM | POA: Diagnosis not present

## 2017-03-04 DIAGNOSIS — Z124 Encounter for screening for malignant neoplasm of cervix: Secondary | ICD-10-CM | POA: Insufficient documentation

## 2017-03-04 DIAGNOSIS — R079 Chest pain, unspecified: Secondary | ICD-10-CM

## 2017-03-04 DIAGNOSIS — K219 Gastro-esophageal reflux disease without esophagitis: Secondary | ICD-10-CM

## 2017-03-04 DIAGNOSIS — T192XXA Foreign body in vulva and vagina, initial encounter: Secondary | ICD-10-CM

## 2017-03-04 DIAGNOSIS — Z0000 Encounter for general adult medical examination without abnormal findings: Secondary | ICD-10-CM

## 2017-03-04 DIAGNOSIS — T192XXD Foreign body in vulva and vagina, subsequent encounter: Secondary | ICD-10-CM | POA: Diagnosis not present

## 2017-03-04 LAB — POCT WET PREP (WET MOUNT)
CLUE CELLS WET PREP WHIFF POC: NEGATIVE
TRICHOMONAS WET PREP HPF POC: ABSENT

## 2017-03-04 NOTE — Progress Notes (Signed)
Subjective: CC: f/u vaginal bleeding HPI: Patient is a 61 y.o. female presenting to clinic today for a f/u on vaginal bleeding.  Vaginal bleeding: At the patient's last visit, she was noted to have retained Estrings that she states she hadn't used in several years. Since that time she's completed the antibiotics. No vaginal d/c or bleeding. No pelvic pain.   Chest/abdominal pain: Tuesday she developed significant pain from her chest (nipple line) down into her abdomen and into her back. The pain came on suddenly while she was sleeping and woke her up. It was gnawing, twisting, burning pain but not stabbing. 10/10.  Walking didn't improve it. She took 2 Tums and protonix.  When she got on her hands and knees and leaned forward, it got better. It took about 25 minutes for it to resolve. She's unsure if it was the medication, the position she got in, or just time that helped the symptoms resolve.  She had cold sweats during this.  She felt she may have been SOB but wasn't sure if was due to anxiety. She was wondering if it was acid reflux or gas. Intermittently takes Protonix for acid reflux- twice weekly. No issues since then. No cardiac issues. Non-smoker.  Fatigue: present but improved.   Social History: non-smoker   ROS: All other systems reviewed and are negative.  Past Medical History Patient Active Problem List   Diagnosis Date Noted  . Vaginal foreign object, initial encounter 02/11/2017  . Other malaise and fatigue 03/08/2014  . Insomnia 03/08/2014  . Snoring 03/08/2014  . Anemia, unspecified 02/23/2013  . Health care maintenance 02/23/2013  . ALLERGIC RHINITIS 03/26/2008  . Goiter 01/26/2007  . GASTROESOPHAGEAL REFLUX, NO ESOPHAGITIS 01/26/2007    Medications- reviewed and updated Current Outpatient Prescriptions  Medication Sig Dispense Refill  . ciprofloxacin (CIPRO) 500 MG tablet Take 1 tablet (500 mg total) by mouth 2 (two) times daily. 14 tablet 0  . loratadine  (CLARITIN) 10 MG tablet Take 10 mg by mouth daily.      . metroNIDAZOLE (FLAGYL) 500 MG tablet Take 1 tablet (500 mg total) by mouth 2 (two) times daily. 14 tablet 0  . omeprazole (PRILOSEC) 20 MG capsule Take 20 mg by mouth daily.       No current facility-administered medications for this visit.     Objective: Office vital signs reviewed. BP 136/80   Pulse (!) 101   Temp 98.1 F (36.7 C) (Oral)   Ht  (1.676 m)   Wt 169 lb 3.2 oz (76.7 kg)   SpO2 95%   BMI 27.31 kg/m    Physical Examination:  General: Awake, alert, well- nourished, NAD Neck: diffuse, enlarged thyroid without specific nodules or pain. Cardio: RRR, no m/r/g noted.  Pulm: No increased WOB.  CTAB, without wheezes, rhonchi or crackles noted. No stridor GYN:  External genitalia within normal limits.  Vaginal mucosa pink, moist, normal rugae with some linear discoloration which appears to be in the shape of the Estring that was previous removed.   Unable to visualize cervix. No discharge.     EKG: NSR HR 88. No ST elevation or depression.   Assessment/Plan: Goiter Thyroid fxn normal. Thyroid diffusely enlarged,non-tender. No breathing issues. - Soft tissue U/S of neck.  GASTROESOPHAGEAL REFLUX, NO ESOPHAGITIS Suspect chest pain was secondary to GERD vs gas. EKG unremarkable. Advised pt to take PPI daily.  Discussed going to ED immediately if she has that pain again to be evaluated.   Vaginal  foreign object, initial encounter Pelvic exam unremarkable today. Wet prep negative.  Health care maintenance Pap smear performed today.   Orders Placed This Encounter  Procedures  . US Soft Tissue Head/Neck    Standing Status:   Future    Standing Expiration Date:   05/04/2018    Order Specific Question:   Reason for Exam (SYMPTOM  OR DIAGNOSIS REQUIRED)    Answer:   goiter, normal thyroid panel    Order Specific Question:   Preferred imaging location?    Answer:   [facility]  . POCT Wet Prep [employer])  . EKG 12-Lead    No orders of the defined types were placed in this encounter.   Joanna Puff PGY-3, [facility]

## 2017-03-04 NOTE — Assessment & Plan Note (Signed)
Pelvic exam unremarkable today. Wet prep negative.

## 2017-03-04 NOTE — Assessment & Plan Note (Signed)
Pap smear performed today. 

## 2017-03-04 NOTE — Assessment & Plan Note (Signed)
Suspect chest pain was secondary to GERD vs gas. EKG unremarkable. Advised pt to take PPI daily.  Discussed going to ED immediately if she has that pain again to be evaluated.

## 2017-03-04 NOTE — Assessment & Plan Note (Signed)
Thyroid fxn normal. Thyroid diffusely enlarged,non-tender. No breathing issues. - Soft tissue U/S of neck.

## 2017-03-04 NOTE — Patient Instructions (Addendum)
Your EKG looks normal.  If you have the chest, abdominal, and back pain again, please seek care at that time.  I have ordered an ultrasound of your thyroid, our office will contact you with the results.

## 2017-03-08 ENCOUNTER — Encounter: Payer: Self-pay | Admitting: Family Medicine

## 2017-03-08 LAB — CYTOLOGY - PAP
CHLAMYDIA, DNA PROBE: NEGATIVE
DIAGNOSIS: NEGATIVE
HPV: NOT DETECTED
Neisseria Gonorrhea: NEGATIVE

## 2017-03-10 ENCOUNTER — Ambulatory Visit (HOSPITAL_COMMUNITY)
Admission: RE | Admit: 2017-03-10 | Discharge: 2017-03-10 | Disposition: A | Discharge status: Home / Self Care | Payer: BLUE CROSS/BLUE SHIELD | Source: Ambulatory Visit | Attending: Family Medicine | Admitting: Family Medicine

## 2017-03-10 DIAGNOSIS — E049 Nontoxic goiter, unspecified: Secondary | ICD-10-CM

## 2017-03-10 DIAGNOSIS — E042 Nontoxic multinodular goiter: Secondary | ICD-10-CM | POA: Diagnosis not present

## 2017-03-11 ENCOUNTER — Telehealth: Payer: Self-pay | Admitting: Family Medicine

## 2017-03-11 ENCOUNTER — Other Ambulatory Visit: Payer: Self-pay | Admitting: Family Medicine

## 2017-03-11 DIAGNOSIS — E041 Nontoxic single thyroid nodule: Secondary | ICD-10-CM

## 2017-03-11 NOTE — Telephone Encounter (Signed)
Ultrasound revealed a right mid solid nodule measuring 5.7cm and a left mid solid nodule measuring 5.3cm with recommendations for FNA.   Will need thyroid biopsy ultrasound.  Attempted to call pt with these results, no answer. Left HIPAA compliant VM. Will attempt to call back again.  Joanna Puff, MD [facility] Family Medicine Resident  03/11/2017, 9:23 AM

## 2017-03-11 NOTE — Telephone Encounter (Signed)
Patient returned call. Discussed results. Will order U/S with biopsy of thyroid nodule. Pt asks if it can be removed, as she doesn't want it to get any bigger. No respiratory compromise. No issues with pain or movement of head/neck. Will discuss after biopsy results.  Joanna Puff, MD [facility] Resident  03/11/2017, 11:05 AM

## 2017-03-24 ENCOUNTER — Other Ambulatory Visit: Payer: Self-pay | Admitting: General Surgery

## 2017-03-25 ENCOUNTER — Ambulatory Visit (HOSPITAL_COMMUNITY)
Admission: RE | Admit: 2017-03-25 | Discharge: 2017-03-25 | Disposition: A | Discharge status: Home / Self Care | Payer: BLUE CROSS/BLUE SHIELD | Source: Ambulatory Visit | Attending: Family Medicine | Admitting: Family Medicine

## 2017-03-25 DIAGNOSIS — E041 Nontoxic single thyroid nodule: Secondary | ICD-10-CM

## 2017-04-01 ENCOUNTER — Ambulatory Visit (HOSPITAL_COMMUNITY)
Admission: RE | Admit: 2017-04-01 | Discharge: 2017-04-01 | Disposition: A | Discharge status: Home / Self Care | Payer: BLUE CROSS/BLUE SHIELD | Source: Ambulatory Visit | Attending: Family Medicine | Admitting: Family Medicine

## 2017-04-01 ENCOUNTER — Other Ambulatory Visit: Payer: Self-pay | Admitting: Family Medicine

## 2017-04-01 DIAGNOSIS — E041 Nontoxic single thyroid nodule: Secondary | ICD-10-CM

## 2017-04-01 DIAGNOSIS — E042 Nontoxic multinodular goiter: Secondary | ICD-10-CM | POA: Diagnosis not present

## 2017-04-01 MED ORDER — LIDOCAINE HCL 1 % IJ SOLN
INTRAMUSCULAR | Status: AC
  Filled 2017-04-01: qty 20

## 2017-04-01 NOTE — Procedures (Signed)
   US guided right thyroid nodule biopsy  25 g needle sample x 4  Pt tolerated well cyto pending

## 2017-04-01 NOTE — Procedures (Signed)
   US guided left thyroid nodule 25 g needle sample x 4  Tolerated well  cyto pending

## 2017-04-05 ENCOUNTER — Telehealth: Payer: Self-pay | Admitting: Family Medicine

## 2017-04-05 DIAGNOSIS — E041 Nontoxic single thyroid nodule: Secondary | ICD-10-CM

## 2017-04-05 NOTE — Telephone Encounter (Signed)
Called to give pt biopsy results, no answer, left a HIPAA complaint VM.  If she returns call please relay the following information:  Her thyroid biopsy results were normal, there was NO evidence of cancer. If she would like to be referred to a surgeon to see if it could be removed as it is bothersome, we can make that referral for her.  Thanks, Joanna Puffrystal S. Dorsey, MD [facility] Resident  04/05/2017, 3:56 PM

## 2017-04-05 NOTE — Telephone Encounter (Signed)
Attempted to call pt concerning normal thyroid biopsy results, no answer.  Will attempt to call again.  Joanna Puffrystal S. Dorsey, MD [facility] Resident  04/05/2017, 11:35 AM

## 2017-04-06 NOTE — Telephone Encounter (Signed)
Pt was advised and would like to get a referral to have it removed. ep

## 2017-04-11 NOTE — Telephone Encounter (Signed)
Placed order for surgery referral, please let pt know that they will contact her. There is no guarantee that they will perform the surgery or that insurance will pay, but she can at least meet with them.  Thanks, Joanna Puffrystal S. Dorsey, MD [facility] Resident  04/11/2017, 8:19 AM

## 2017-04-11 NOTE — Addendum Note (Signed)
Addended by: Joanna PuffRSEY, CRYSTAL S on: 04/11/2017 08:21 AM   Modules accepted: Orders

## 2017-04-11 NOTE — Telephone Encounter (Signed)
Pt informed. Suanne Minahan, CMA  

## 2017-12-25 IMAGING — US US SOFT TISSUE HEAD/NECK
1 series · 13 of 25 positions shown · non-contrast
Comparison: Ultrasound thyroid report from 12/08/2001, images not
available.

CLINICAL DATA: Goiter.  Previous thyroid biopsy in 3449.

EXAM:
THYROID ULTRASOUND
TECHNIQUE: Ultrasound examination of the thyroid gland and adjacent soft
tissues was performed.

[Series 1: us soft tissue head/neck · 0.07mm/px · 55 acquisitions, 13 frames shown]
[im 1/55]
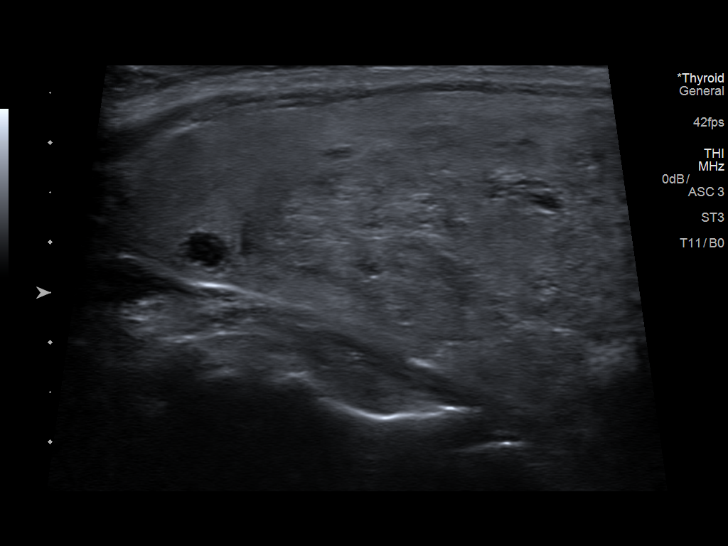
[im 5/55]
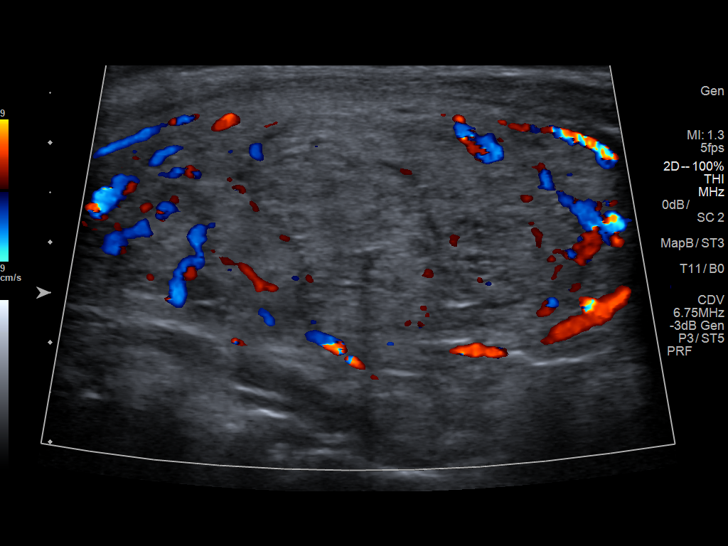
[im 10/55]
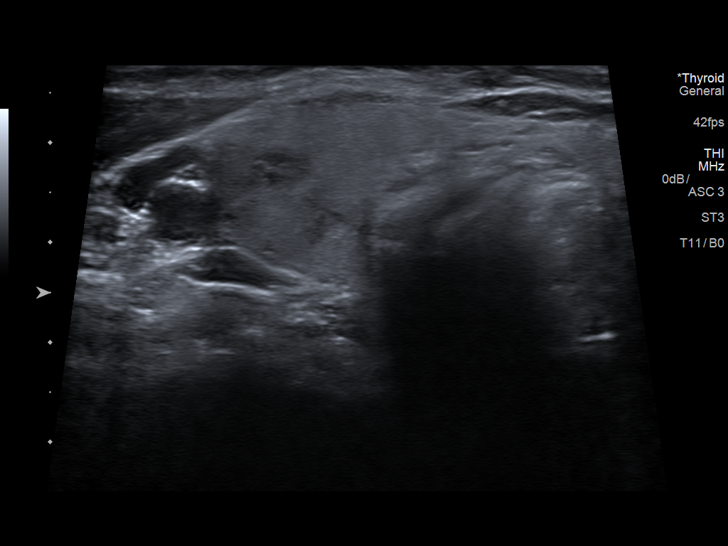
[im 14/55]
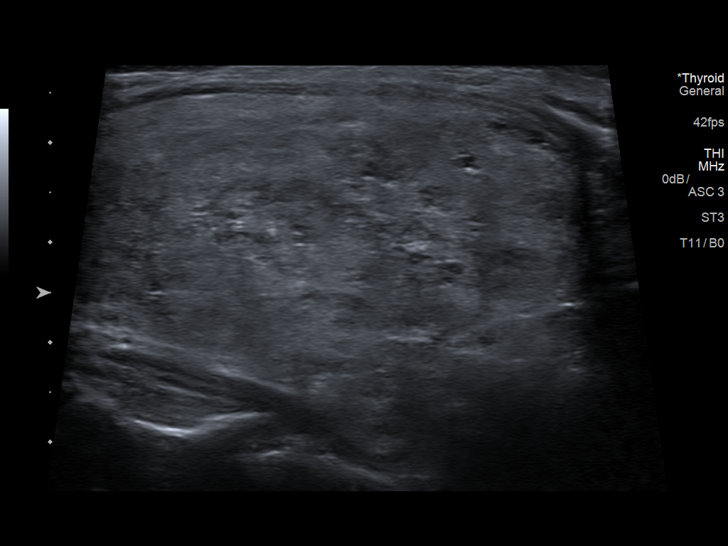
[im 19/55]
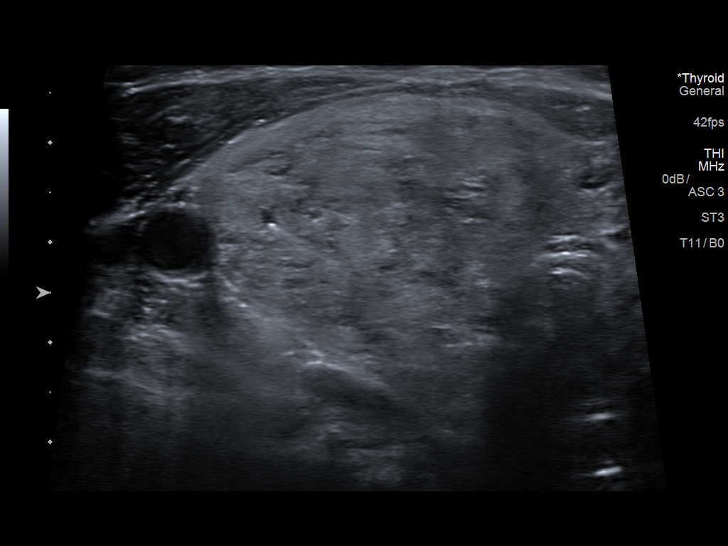
[im 23/55]
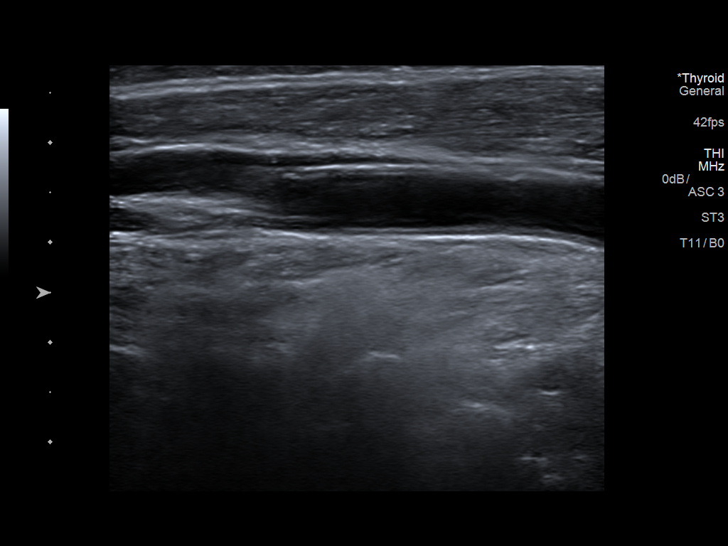
[im 28/55]
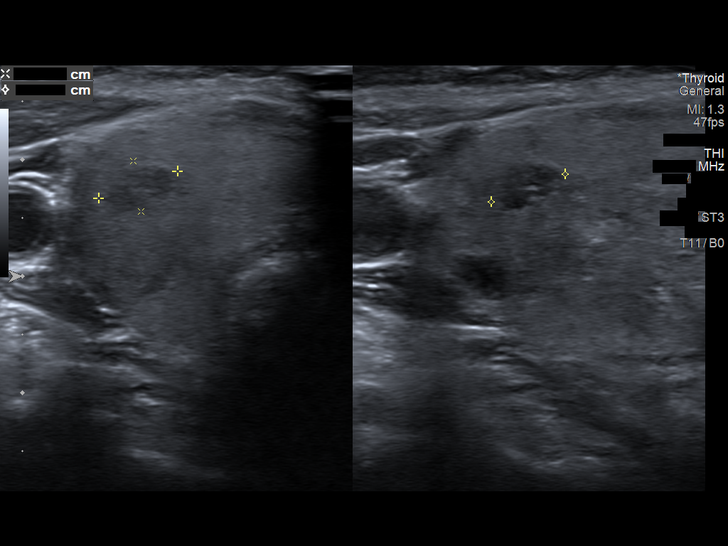
[im 32/55]
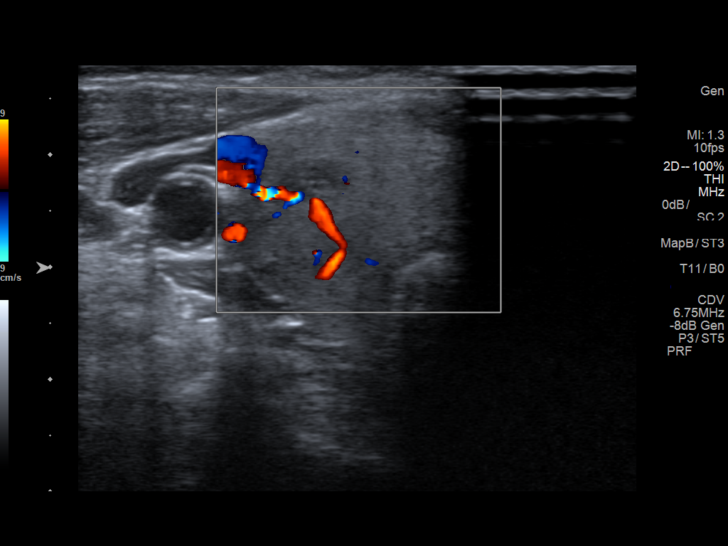
[im 37/55]
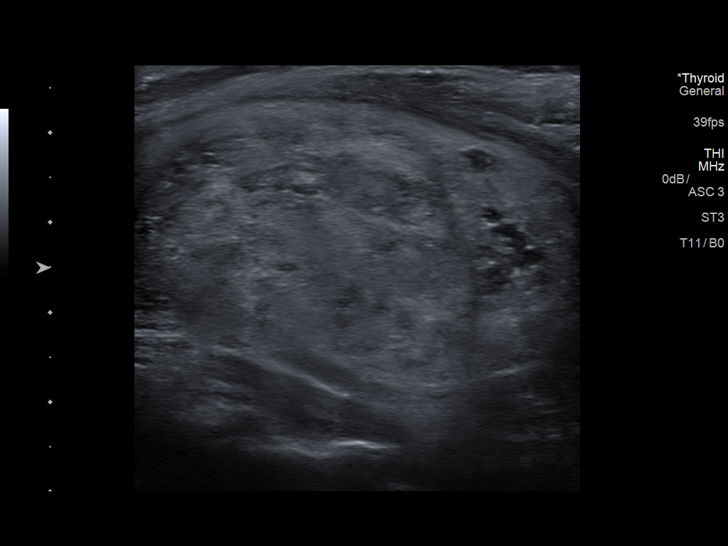
[im 41/55]
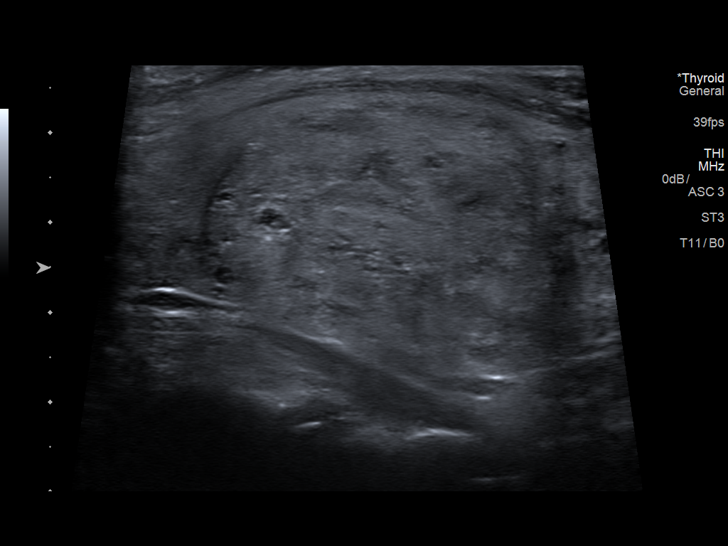
[im 46/55]
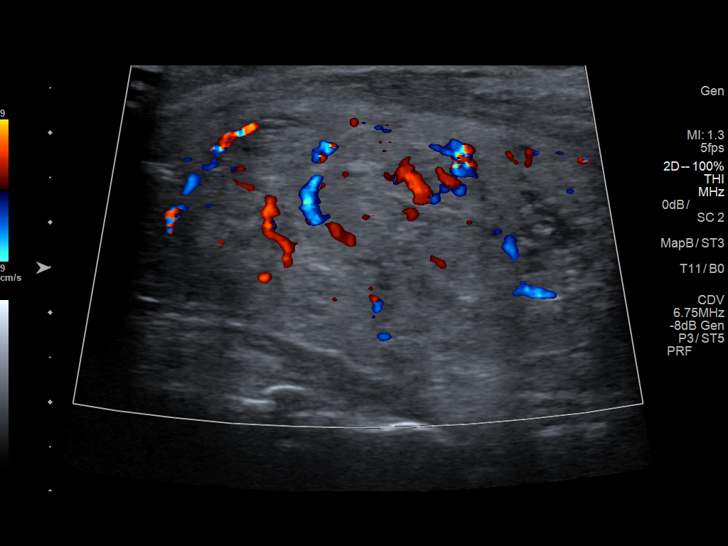
[im 50/55]
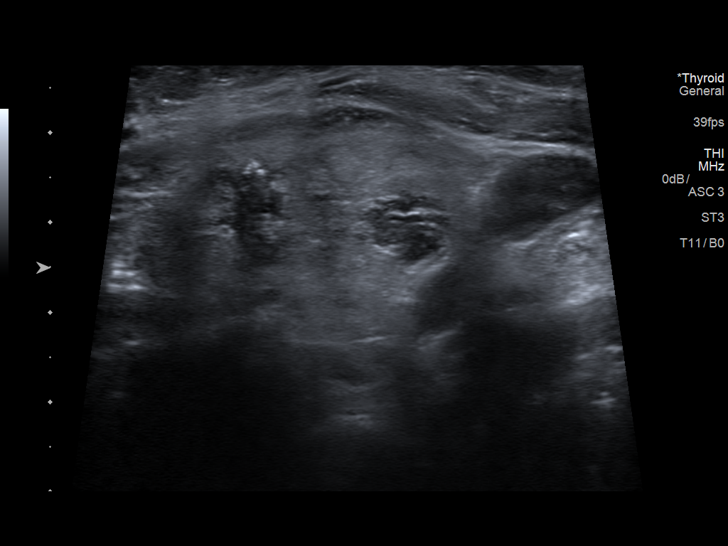
[im 55/55]
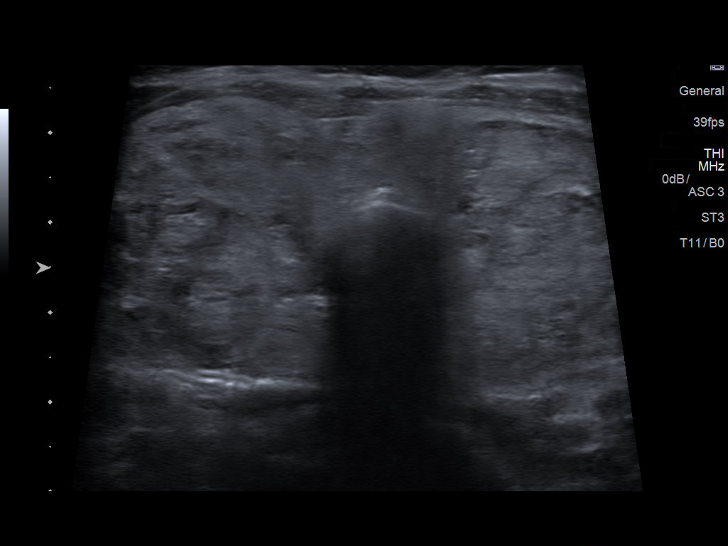

[13 of 25 positions shown; findings below may reference images not displayed]

FINDINGS: Parenchymal Echotexture: Moderately heterogenous

Isthmus: 1.2 cm in the AP dimension

Right lobe: 7.4 x 3.9 x 3.4 cm

Left lobe: 6.8 x 3.5 x 2.8 cm

_________________________________________________________

Estimated total number of nodules >/= 1 cm: 2

Number of spongiform nodules >/=  2 cm not described below (TR1): 0

Number of mixed cystic and solid nodules >/= 1.5 cm not described
below (TR2): 0

_________________________________________________________

Thyroid tissue is moderately heterogeneous but there are areas of
normal thyroid tissue. It appears that large portions of both the
left and right thyroid lobes have been replaced by a large nodule or
conglomeration of nodules. See TIRADS nodule description below:

Nodule # 1:

Location: Right; Mid

Maximum size: 5.7 cm; Other 2 dimensions: 2.6 x 3.3 cm

Composition: solid/almost completely solid (2)

Echogenicity: isoechoic (1)

Shape: not taller-than-wide (0)

Margins: ill-defined (0)

Echogenic foci: none (0)

ACR TI-RADS total points: 3.

ACR TI-RADS risk category: TR3 (3 points).

ACR TI-RADS recommendations:

**Given size (>/= 2.5 cm) and appearance, fine needle aspiration of
this mildly suspicious nodule should be considered based on TI-RADS
criteria.

_________________________________________________________

Nodule # 2:

Location: Left; Mid

Maximum size: 5.3 cm; Other 2 dimensions: 2.9 x 3.1 cm

Composition: solid/almost completely solid (2)

Echogenicity: isoechoic (1)

Shape: not taller-than-wide (0)

Margins: ill-defined (0)

Echogenic foci: none (0)

ACR TI-RADS total points: 3.

ACR TI-RADS risk category: TR3 (3 points).

ACR TI-RADS recommendations:

**Given size (>/= 2.5 cm) and appearance, fine needle aspiration of
this mildly suspicious nodule should be considered based on TI-RADS
criteria.

_________________________________________________________

There are 2 subcentimeter hypoechoic nodules in the superior right
thyroid lobe. These nodules do not meet criteria for biopsy.
IMPRESSION: Thyroid tissue is markedly enlarged and diffusely heterogeneous.
Left and right thyroid lobes have been replaced by large nodules.
Bilateral large nodules both meet criteria for biopsy.

The above is in keeping with the ACR TI-RADS recommendations - [HOSPITAL] 3817;[DATE].

## 2018-01-16 IMAGING — US US BIOPSY
1 series · 13 of 19 positions shown · non-contrast
Comparison: US Soft Tissue Head/Neck 03/10/2017

MEDICATIONS:
10 cc 1% lidocaine

COMPLICATIONS:
None immediate.

INDICATION: Right thyroid nodule 5.7 cm

Left thyroid nodule 5.3 Cm
EXAM:
ULTRASOUND GUIDED FINE NEEDLE ASPIRATION OF INDETERMINATE THYROID
NODULE
TECHNIQUE: Informed written consent was obtained from the patient after a
discussion of the risks, benefits and alternatives to treatment.
Questions regarding the procedure were encouraged and answered. A
timeout was performed prior to the initiation of the procedure.

[Series 1: us biopsy · 0.06mm/px · 19 acquisitions, 13 frames shown]
[im 1/19]
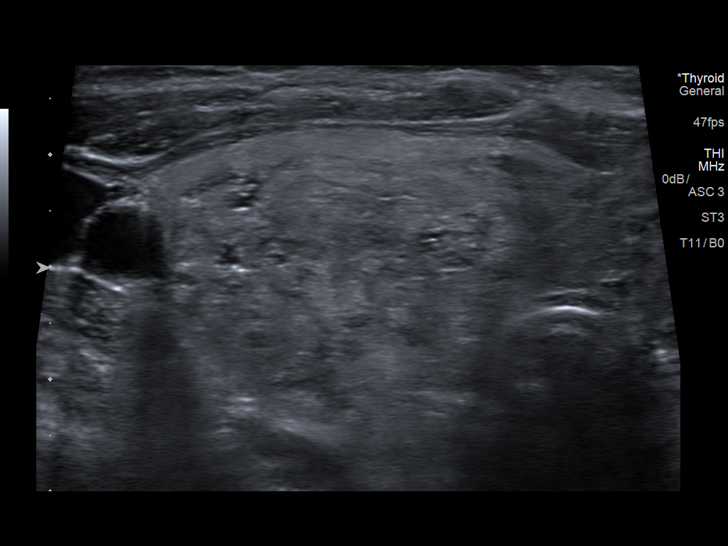
[im 3/19]
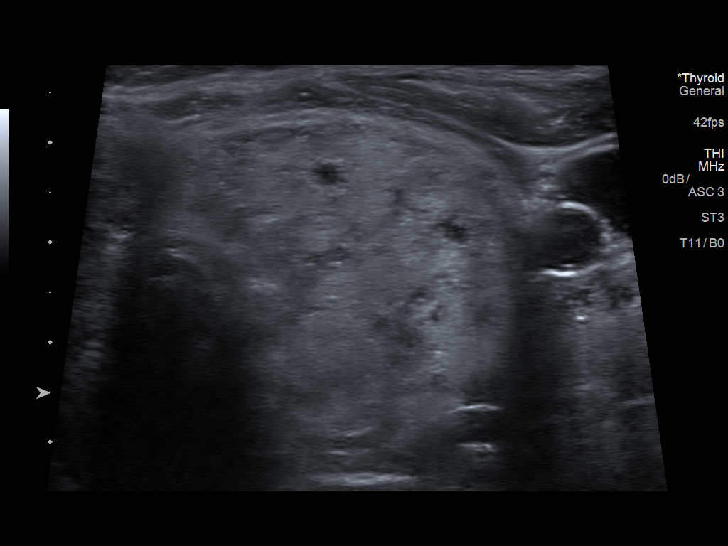
[im 4/19]
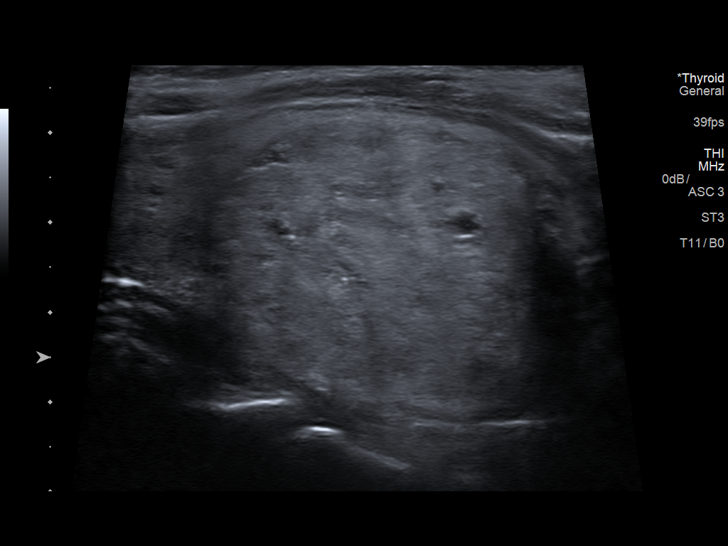
[im 6/19]
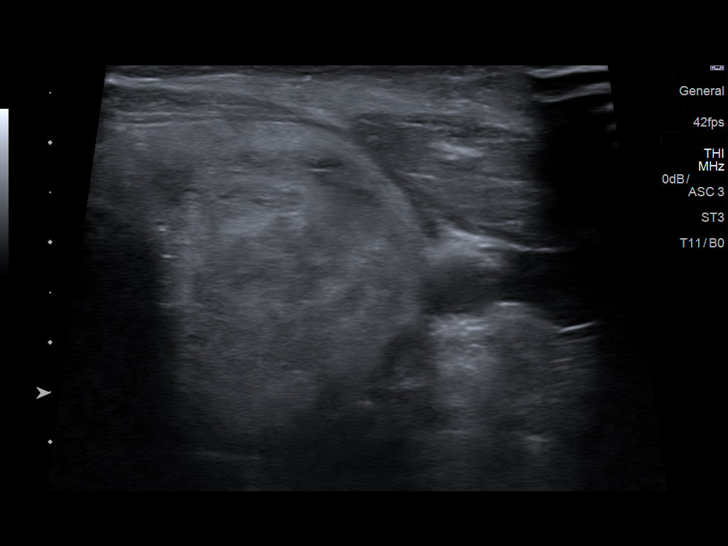
[im 7/19]
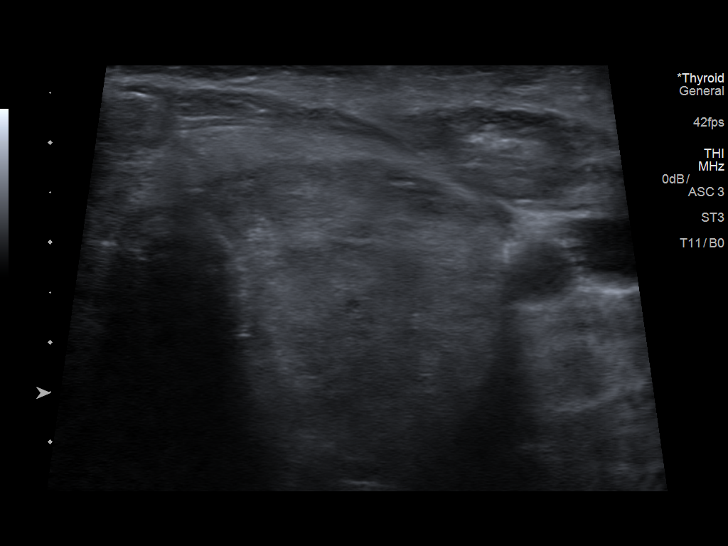
[im 9/19]
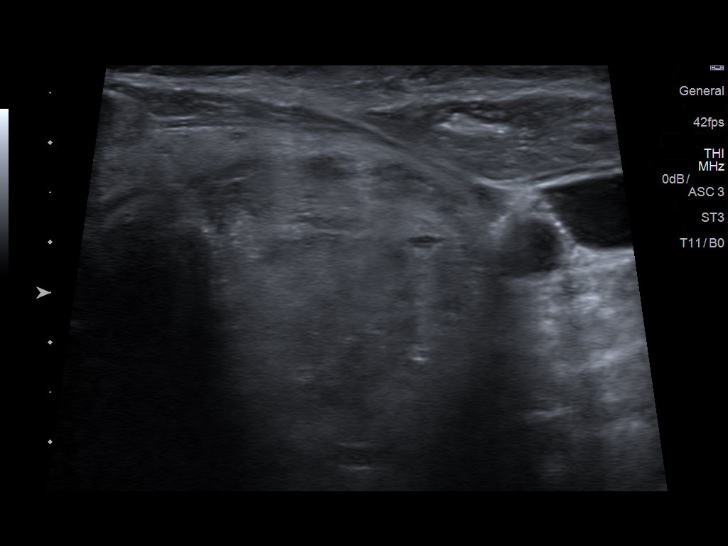
[im 10/19]
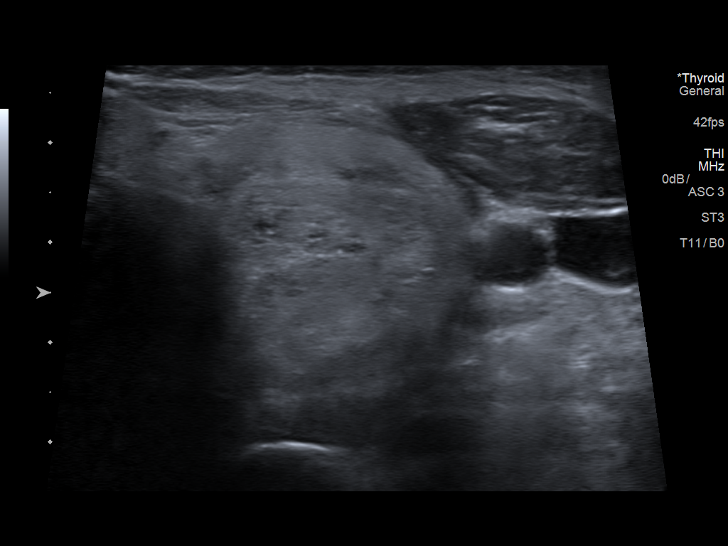
[im 11/19]
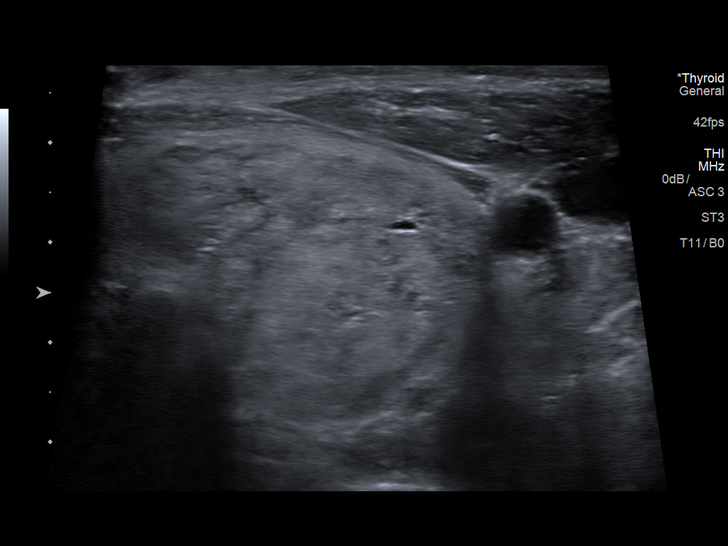
[im 13/19]
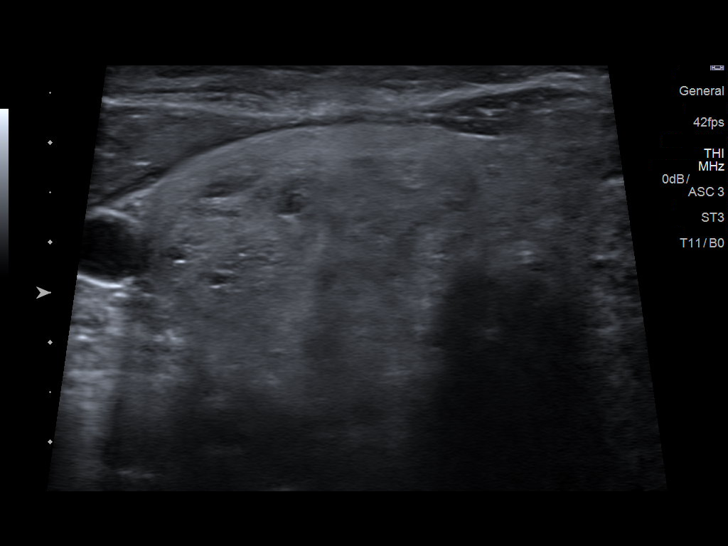
[im 14/19]
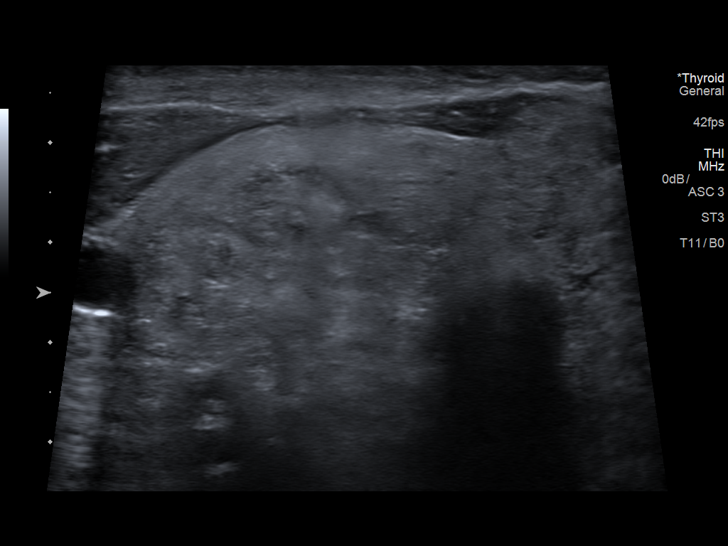
[im 16/19]
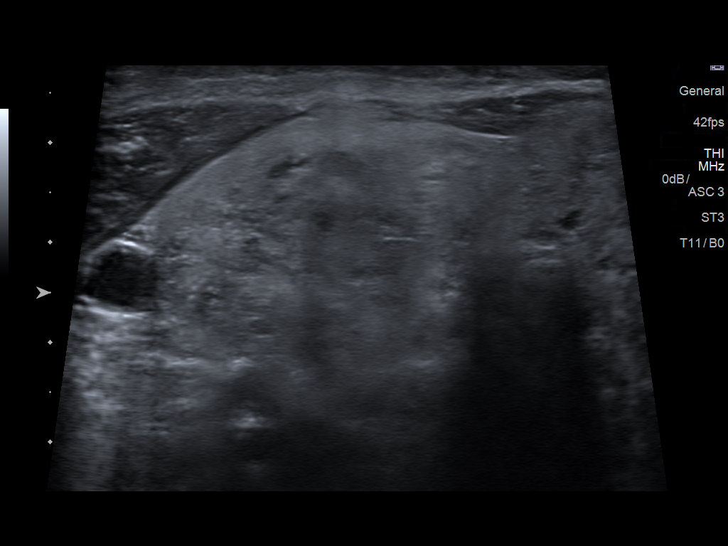
[im 17/19]
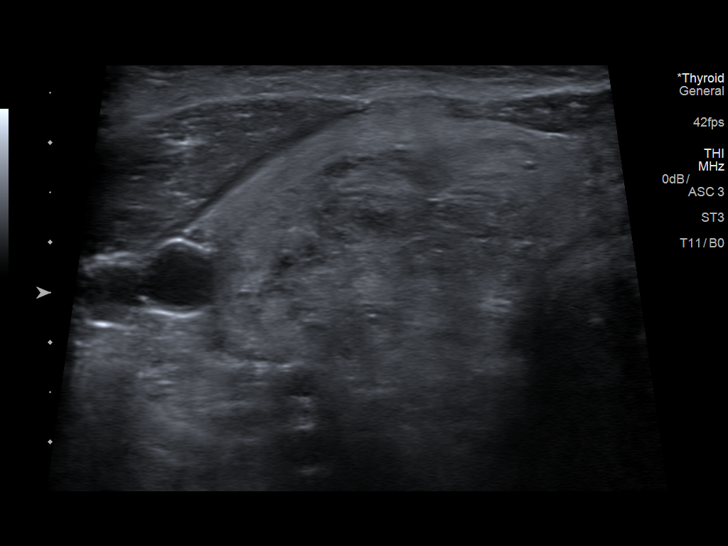
[im 19/19]
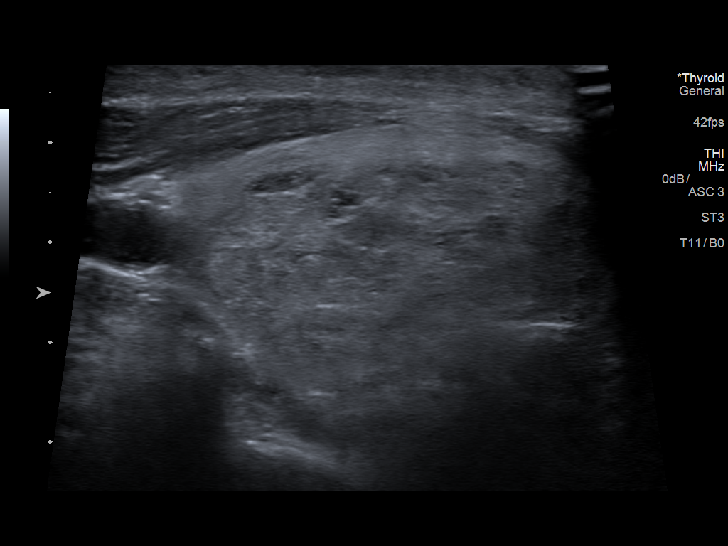

[13 of 19 positions shown; findings below may reference images not displayed]

Pre-procedural ultrasound scanning demonstrated unchanged size and
appearance of the indeterminate nodules within the right and left
thyroid

The procedure was planned. The neck was prepped in the usual sterile
fashion, and a sterile drape was applied covering the operative
field. A timeout was performed prior to the initiation of the
procedure. Local anesthesia was provided with 1% lidocaine.

Under direct ultrasound guidance, 4 FNA biopsies were performed of
the right thyroid nodule with a 25 gauge needle. Multiple ultrasound
images were saved for procedural documentation purposes. The samples
were prepared and submitted to pathology.

Under direct ultrasound guidance, 4 FNA biopsies were performed of
the left thyroid nodule with a 25 gauge needle. Multiple ultrasound
images were saved for procedural documentation purposes. The samples
were prepared and submitted to pathology.

Limited post procedural scanning was negative for hematoma or
additional complication. Dressings were placed. The patient
tolerated the above procedures procedure well without immediate
postprocedural complication.
FINDINGS: FINDINGS
Nodule reference number based on prior diagnostic ultrasound: 1

Maximum size:  5.7 cm

Location: Right; Mid

ACR TI-RADS risk category: TR3 (3 points)

Reason for biopsy: meets ACR TI-RADS criteria

_________________________________________________________

Nodule reference number based on prior diagnostic ultrasound: 2

Maximum size:  5.3 cm

Location: Left; Mid

ACR TI-RADS risk category: TR3 (3 points)

Reason for biopsy: meets ACR TI-RADS criteria

Ultrasound imaging confirms appropriate placement of the needles
within the thyroid nodule.
IMPRESSION: 1. Technically successful ultrasound guided fine needle aspiration
of right thyroid nodule
2. Technically successful ultrasound guided fine needle aspiration
of left thyroid nodule

## 2018-01-18 DIAGNOSIS — D1800 Hemangioma unspecified site: Secondary | ICD-10-CM | POA: Diagnosis not present

## 2018-01-18 DIAGNOSIS — L821 Other seborrheic keratosis: Secondary | ICD-10-CM | POA: Diagnosis not present

## 2018-01-18 DIAGNOSIS — D229 Melanocytic nevi, unspecified: Secondary | ICD-10-CM | POA: Diagnosis not present

## 2019-08-15 ENCOUNTER — Other Ambulatory Visit: Payer: Self-pay

## 2019-08-15 DIAGNOSIS — Z20822 Contact with and (suspected) exposure to covid-19: Secondary | ICD-10-CM

## 2019-08-16 LAB — NOVEL CORONAVIRUS, NAA: SARS-CoV-2, NAA: NOT DETECTED

## 2022-03-24 DIAGNOSIS — J209 Acute bronchitis, unspecified: Secondary | ICD-10-CM | POA: Diagnosis not present

## 2022-04-30 DIAGNOSIS — H524 Presbyopia: Secondary | ICD-10-CM | POA: Diagnosis not present

## 2022-04-30 DIAGNOSIS — Z961 Presence of intraocular lens: Secondary | ICD-10-CM | POA: Diagnosis not present

## 2022-04-30 DIAGNOSIS — Z8669 Personal history of other diseases of the nervous system and sense organs: Secondary | ICD-10-CM | POA: Diagnosis not present

## 2022-04-30 DIAGNOSIS — H16223 Keratoconjunctivitis sicca, not specified as Sjogren's, bilateral: Secondary | ICD-10-CM | POA: Diagnosis not present

## 2022-05-04 ENCOUNTER — Encounter: Payer: Self-pay | Admitting: *Deleted

## 2022-06-18 ENCOUNTER — Ambulatory Visit (INDEPENDENT_AMBULATORY_CARE_PROVIDER_SITE_OTHER): Payer: Medicare Other | Admitting: Family Medicine

## 2022-06-18 ENCOUNTER — Encounter: Payer: Self-pay | Admitting: Family Medicine

## 2022-06-18 VITALS — BP 135/79 | HR 106 | Wt 157.4 lb

## 2022-06-18 DIAGNOSIS — Z8342 Family history of familial hypercholesterolemia: Secondary | ICD-10-CM

## 2022-06-18 DIAGNOSIS — Z8639 Personal history of other endocrine, nutritional and metabolic disease: Secondary | ICD-10-CM | POA: Diagnosis not present

## 2022-06-18 DIAGNOSIS — Z0000 Encounter for general adult medical examination without abnormal findings: Secondary | ICD-10-CM | POA: Diagnosis not present

## 2022-06-18 DIAGNOSIS — E039 Hypothyroidism, unspecified: Secondary | ICD-10-CM

## 2022-06-18 DIAGNOSIS — J302 Other seasonal allergic rhinitis: Secondary | ICD-10-CM | POA: Diagnosis not present

## 2022-06-18 DIAGNOSIS — Z833 Family history of diabetes mellitus: Secondary | ICD-10-CM | POA: Diagnosis not present

## 2022-06-18 LAB — POCT GLYCOSYLATED HEMOGLOBIN (HGB A1C): HbA1c, POC (controlled diabetic range): 5.7 % (ref 0.0–7.0)

## 2022-06-18 NOTE — Assessment & Plan Note (Signed)
Was on synthroid at one time, but then was hyertherapeutic. No longer takes.  - TSH

## 2022-06-18 NOTE — Assessment & Plan Note (Signed)
-   Half claritin a day (full claritin dries out eyes)

## 2022-06-18 NOTE — Assessment & Plan Note (Signed)
Will think about shingrex and pneumonia vaccine Has had colonoscopies and have been normal. Normal mammograms

## 2022-06-18 NOTE — Assessment & Plan Note (Signed)
Most likely in the setting of poor sleep. Denies insomnia, says sleep is not restful and that she does not stay asleep throughout the night.  - Sleep diary for 1 week.

## 2022-06-18 NOTE — Progress Notes (Signed)
    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[employer] [place]  Lives with: Nephew  Smoking/Vaping:  None Alcohol: Once a month one glass of wine Marijuana/ilicit substances:  None  Exercise: Takes lots of stairs daily, Goes on walks daily    Allergies: Seasonal allergies, sulfa Daily medications: claritin (dries her out if she takes daily)  Medical diagnoses: Hypothyroidism Surgical hx: Reconstructive surgery in 2005, complete hysterectomy (s/t fibroids), tarsal bone surgery in R foot, cataract surgery bilateral  Family hx:  Mom was diabetic, Stroke, asthma, HTN, Sister uterine cancer, Uncle jaw cancer     OBJECTIVE:   BP 135/79   Pulse (!) 106   Wt 157 lb 6.4 oz (71.4 kg)   SpO2 98%   BMI 25.41 kg/m   Physical Exam Constitutional:      Appearance: Normal appearance.  HENT:     Mouth/Throat:     Mouth: Mucous membranes are moist.  Eyes:     Extraocular Movements: Extraocular movements intact.     Conjunctiva/sclera: Conjunctivae normal.     Pupils: Pupils are equal, round, and reactive to light.  Cardiovascular:     Rate and Rhythm: Normal rate.     Pulses: Normal pulses.     Heart sounds: Normal heart sounds.  Pulmonary:     Effort: Pulmonary effort is normal.     Breath sounds: Normal breath sounds.  Abdominal:     General: Abdomen is flat.     Palpations: Abdomen is soft.     Tenderness: There is no abdominal tenderness.  Musculoskeletal:        General: Normal range of motion.  Skin:    General: Skin is warm and dry.  Neurological:     Mental Status: She is alert and oriented to person, place, and time.  Psychiatric:        Mood and Affect: Mood normal.        Behavior: Behavior normal.        Thought Content: Thought content normal.      ASSESSMENT/PLAN:   Health  care maintenance Will think about shingrex and pneumonia vaccine Has had colonoscopies and have been normal. Normal mammograms  Other malaise and fatigue Most likely in the setting of poor sleep. Denies insomnia, says sleep is not restful and that she does not stay asleep throughout the night.  - Sleep diary for 1 week.   Seasonal allergies - Half claritin a day (full claritin dries out eyes)   History of hypothyroidism Was on synthroid at one time, but then was hyertherapeutic. No longer takes.  - TSH    Lockie Mola, MD [facility]

## 2022-06-19 LAB — LIPID PANEL
Chol/HDL Ratio: 5.1 ratio — ABNORMAL HIGH (ref 0.0–4.4)
Cholesterol, Total: 194 mg/dL (ref 100–199)
HDL: 38 mg/dL — ABNORMAL LOW (ref >39)
LDL Chol Calc (NIH): 117 mg/dL — ABNORMAL HIGH (ref 0–99)
Triglycerides: 223 mg/dL — ABNORMAL HIGH (ref 0–149)
VLDL Cholesterol Cal: 39 mg/dL (ref 5–40)

## 2022-06-19 LAB — TSH: TSH: 0.366 u[IU]/mL — ABNORMAL LOW (ref 0.450–4.500)

## 2022-06-21 ENCOUNTER — Telehealth: Payer: Self-pay | Admitting: Family Medicine

## 2022-06-21 NOTE — Telephone Encounter (Signed)
Called and LVM for patient to call office to schedule follow up appointment per Dr. Marsh Dolly.  Glennie Hawk, CMA

## 2022-06-21 NOTE — Telephone Encounter (Signed)
Called patient at number listen in EMR.  Discussed lab results of TSH, A1c, and lipid panel. Asked to see patient for follow up to discuss starting medication for triglycerides and high cholesterol given ASCVD risk 11%.

## 2022-08-09 DIAGNOSIS — J069 Acute upper respiratory infection, unspecified: Secondary | ICD-10-CM | POA: Diagnosis not present

## 2022-08-09 DIAGNOSIS — H9193 Unspecified hearing loss, bilateral: Secondary | ICD-10-CM | POA: Diagnosis not present

## 2022-08-09 DIAGNOSIS — J209 Acute bronchitis, unspecified: Secondary | ICD-10-CM | POA: Diagnosis not present

## 2023-01-03 ENCOUNTER — Ambulatory Visit (INDEPENDENT_AMBULATORY_CARE_PROVIDER_SITE_OTHER): Payer: Medicare Other

## 2023-01-03 NOTE — Progress Notes (Unsigned)
I connected with  Morgan Moreno on 01/03/23 by a audio enabled telemedicine application and verified that I am speaking with the correct person using two identifiers.  Patient Location: Home  Provider Location: Office/Clinic  I discussed the limitations of evaluation and management by telemedicine. The patient expressed understanding and agreed to proceed.  Subjective:   Morgan Moreno is a 67 y.o. female who presents for Medicare Annual (Subsequent) preventive examination.  Review of Systems    Per HPI unless specifically indicated below.        Objective:    There were no vitals filed for this visit. There is no height or weight on file to calculate BMI.     06/18/2022    3:07 PM 03/04/2017    2:09 PM 02/11/2017    1:50 PM  Advanced Directives  Does Patient Have a Medical Advance Directive? No No No  Would patient like information on creating a medical advance directive?  No - Patient declined No - Patient declined    Current Medications (verified) Outpatient Encounter Medications as of 01/03/2023  Medication Sig   loratadine (CLARITIN) 10 MG tablet Take 10 mg by mouth daily.     naproxen sodium (ANAPROX) 220 MG tablet Take 220 mg by mouth 2 (two) times daily as needed (pain).   omeprazole (PRILOSEC) 20 MG capsule Take 20 mg by mouth daily.     Polyethyl Glycol-Propyl Glycol (SYSTANE OP) Apply 2 drops to eye 3 (three) times daily as needed (allergy relief).   No facility-administered encounter medications on file as of 01/03/2023.    Allergies (verified) Sulfonamide derivatives and Oxycodone-acetaminophen   History: Past Medical History:  Diagnosis Date   Allergy    Anemia    "a long time ago," 13+ years ago as of 2014   Arthritis    Goiter 01/26/2007   Qualifier: Diagnosis of  By: Darylene Price MD, Mark     Sickle cell anemia ([place])    ?trait   Past Surgical History:  Procedure Laterality Date   ABDOMINAL HYSTERECTOMY     At age 1, for  fibroids/bleeding   CATARACT EXTRACTION Left 02/05/14   Removed by Dr. Herbert Deaner in [place] Right    "Metatarsal bone dropped"   HAND SURGERY Right    "Replaced cartilage"   Family History  Problem Relation Age of Onset   Kidney disease Mother    Diabetes Mother    Hypertension Mother    Stroke Mother    Mental illness Father        schizophrenic   Asthma Sister    Asthma Brother    Cancer Paternal Uncle    Social History   Socioeconomic History   Marital status: Single    Spouse name: Not on file   Number of children: Not on file   Years of education: Not on file   Highest education level: Not on file  Occupational History   Not on file  Tobacco Use   Smoking status: Never   Smokeless tobacco: Never  Substance and Sexual Activity   Alcohol use: Yes    Comment: "maybe two drinks a year"   Drug use: No   Sexual activity: Never  Other Topics Concern   Not on file  Social History Narrative   Not on file   Social Determinants of Health   Financial Resource Strain: Not on file  Food Insecurity: Not on file  Transportation Needs: Not on file  Physical Activity:  Not on file  Stress: Not on file  Social Connections: Not on file    Tobacco Counseling Counseling given: Not Answered   Clinical Intake:                 Diabetic?***         Activities of Daily Living     No data to display          Patient Care Team: Lowry Ram, MD as PCP - General (Family Medicine)  Indicate any recent Medical Services you may have received from other than [facility] providers in the past year (date may be approximate).     Assessment:   This is a routine wellness examination for Morgan Moreno.  Hearing/Vision screen No results found.  Dietary issues and exercise activities discussed:     Goals Addressed   None    Depression Screen    06/18/2022    3:08 PM 03/04/2017    2:09 PM  PHQ 2/9 Scores  PHQ - 2 Score 0 0  PHQ- 9 Score 2 0     Fall Risk    06/18/2022    3:07 PM  Fall Risk   Falls in the past year? 0    FALL RISK PREVENTION PERTAINING TO THE HOME:  Any stairs in or around the home? {YES/NO:21197} If so, are there any without handrails? {YES/NO:21197} Home free of loose throw rugs in walkways, pet beds, electrical cords, etc? {YES/NO:21197} Adequate lighting in your home to reduce risk of falls? {YES/NO:21197}  ASSISTIVE DEVICES UTILIZED TO PREVENT FALLS:  Life alert? {YES/NO:21197} Use of a cane, walker or w/c? {YES/NO:21197} Grab bars in the bathroom? {YES/NO:21197} Shower chair or bench in shower? {YES/NO:21197} Elevated toilet seat or a handicapped toilet? {YES/NO:21197}  TIMED UP AND GO:  Was the test performed? {YES/NO:21197}.  Length of time to ambulate 10 feet: *** sec.   {Appearance of ZOXW:9604540}  Cognitive Function:        Immunizations Immunization History  Administered Date(s) Administered   Td 02/27/1998   Tdap 02/23/2013    {TDAP status:2101805}  {Flu Vaccine status:2101806}  {Pneumococcal vaccine status:2101807}  {Covid-19 vaccine status:2101808}  Qualifies for Shingles Vaccine? {YES/NO:21197}  Zostavax completed {YES/NO:21197}  {Shingrix Completed?:2101804}  Screening Tests Health Maintenance  Topic Date Due   Medicare Annual Wellness (AWV)  Never done   COVID-19 Vaccine (1) Never done   COLONOSCOPY (Pts 45-18yrs Insurance coverage will need to be confirmed)  Never done   Zoster Vaccines- Shingrix (1 of 2) Never done   MAMMOGRAM  12/28/2008   Pneumonia Vaccine 44+ Years old (1 - PCV) Never done   DEXA SCAN  Never done   INFLUENZA VACCINE  Never done   DTaP/Tdap/Td (3 - Td or Tdap) 02/24/2023   Hepatitis C Screening  Completed   HPV VACCINES  Aged Out    Health Maintenance  Health Maintenance Due  Topic Date Due   Medicare Annual Wellness (AWV)  Never done   COVID-19 Vaccine (1) Never done   COLONOSCOPY (Pts 45-44yrs Insurance coverage will need  to be confirmed)  Never done   Zoster Vaccines- Shingrix (1 of 2) Never done   MAMMOGRAM  12/28/2008   Pneumonia Vaccine 67+ Years old (1 - PCV) Never done   DEXA SCAN  Never done   INFLUENZA VACCINE  Never done    {Colorectal cancer screening:2101809}  {Mammogram status:21018020}  {Bone Density status:21018021}  Lung Cancer Screening: (Low Dose CT Chest recommended if Age 79-80 years, 30 pack-year currently  smoking OR have quit w/in 15years.) {DOES NOT does:27190::"does not"} qualify.   Lung Cancer Screening Referral: ***  Additional Screening:  Hepatitis C Screening: {DOES NOT does:27190::"does not"} qualify; Completed ***  Vision Screening: Recommended annual ophthalmology exams for early detection of glaucoma and other disorders of the eye. Is the patient up to date with their annual eye exam?  {YES/NO:21197} Who is the provider or what is the name of the office in which the patient attends annual eye exams? *** If pt is not established with a provider, would they like to be referred to a provider to establish care? {YES/NO:21197}.   Dental Screening: Recommended annual dental exams for proper oral hygiene  Community Resource Referral / Chronic Care Management: CRR required this visit?  {YES/NO:21197}  CCM required this visit?  {YES/NO:21197}     Plan:     I have personally reviewed and noted the following in the patient's chart:   Medical and social history Use of alcohol, tobacco or illicit drugs  Current medications and supplements including opioid prescriptions. {Opioid Prescriptions:[phone]} Functional ability and status Nutritional status Physical activity Advanced directives List of other physicians Hospitalizations, surgeries, and ER visits in previous 12 months Vitals Screenings to include cognitive, depression, and falls Referrals and appointments  In addition, I have reviewed and discussed with patient certain preventive protocols, quality  metrics, and best practice recommendations. A written personalized care plan for preventive services as well as general preventive health recommendations were provided to patient.     Wilson Singer, [place]   01/03/2023   Nurse Notes: ***

## 2023-01-09 NOTE — Patient Instructions (Signed)

## 2023-01-09 NOTE — Progress Notes (Unsigned)
I connected with  Morgan Moreno on 01/10/2023 by a audio enabled telemedicine application and verified that I am speaking with the correct person using two identifiers.  Patient Location: Home  Provider Location: Home Office  I discussed the limitations of evaluation and management by telemedicine. The patient expressed understanding and agreed to proceed.  Subjective:   Morgan Moreno is a 67 y.o. female who presents for an Initial Medicare Annual Wellness Visit.  Review of Systems    Per HPI unless specifically indicated below.  Cardiac Risk Factors include: advanced age (>46mn, >>73women);female gender          Objective:       06/18/2022    3:06 PM 03/04/2017    2:08 PM 02/11/2017    1:49 PM  Vitals with BMI  Height  5' 6"$  5' 6"$   Weight 157 lbs 6 oz 169 lbs 3 oz 169 lbs  BMI  [id number] Systolic [id number] Diastolic 79 80 88  Pulse [id number] 84    There were no vitals filed for this visit. There is no height or weight on file to calculate BMI.     06/18/2022    3:07 PM 03/04/2017    2:09 PM 02/11/2017    1:50 PM  Advanced Directives  Does Patient Have a Medical Advance Directive? No No No  Would patient like information on creating a medical advance directive?  No - Patient declined No - Patient declined    Current Medications (verified) Outpatient Encounter Medications as of 01/10/2023  Medication Sig   loratadine (CLARITIN) 10 MG tablet Take 10 mg by mouth daily.     naproxen sodium (ANAPROX) 220 MG tablet Take 220 mg by mouth 2 (two) times daily as needed (pain).   omeprazole (PRILOSEC) 20 MG capsule Take 20 mg by mouth daily.     Polyethyl Glycol-Propyl Glycol (SYSTANE OP) Apply 2 drops to eye 3 (three) times daily as needed (allergy relief).   No facility-administered encounter medications on file as of 01/10/2023.    Allergies (verified) Sulfonamide derivatives and Oxycodone-acetaminophen   History: Past Medical History:  Diagnosis Date    Allergy    Anemia    "a long time ago," 13+ years ago as of 2014   Arthritis    Goiter 01/26/2007   Qualifier: Diagnosis of  By: RDarylene PriceMD, Mark     [place]   Past Surgical History:  Procedure Laterality Date   ABDOMINAL HYSTERECTOMY     At age 67 for fibroids/bleeding   CATARACT EXTRACTION Left 02/05/14   [place]    "Metat[place]al bone dropped"   HAND SURGERY Right    "Replaced cartilage"   Family History  Problem Relation Age of Onset   Kidney disease Mother    Diabetes Mother    Hypertension Mother    Stroke Mother    Mental illness Father        schizophrenic   Asthma Sister    Asthma Brother    Cancer Paternal Uncle    Social History   Socioeconomic History   Marital status: Single    Spouse name: Not on file   Number of children: Not on file   Years of education: Not on file   Highest education level: Not on file  Occupational History   Not on file  Tobacco Use   Smoking status: Never  Smokeless tobacco: Never  Substance and Sexual Activity   Alcohol use: Yes    Comment: "maybe two drinks a year"   Drug use: No   Sexual activity: Never  Other Topics Concern   Not on file  Social History Narrative   Not on file   Social Determinants of Health   Financial Resource Strain: Not on file  Food Insecurity: Not on file  Transportation Needs: Not on file  Physical Activity: Not on file  Stress: Not on file  Social Connections: Not on file    Tobacco Counseling Counseling given: Not Answered   Clinical Intake:                 Diabetic?No          Activities of Daily Living     No data to display          Patient Care Team: Lowry Ram, MD as PCP - General (Family Medicine)  Indicate any recent Medical Services you may have received from other than [facility] providers in the past year (date may be approximate).     Assessment:   This is a routine  wellness examination for Morgan Moreno.  Hearing/Vision screen Denies any hearing issues. Denies any change to her vision. Annual Eye Exam.   Dietary issues and exercise activities discussed:     Goals Addressed   None    Depression Screen    06/18/2022    3:08 PM 03/04/2017    2:09 PM  PHQ 2/9 Scores  PHQ - 2 Score 0 0  PHQ- 9 Score 2 0    Fall Risk    06/18/2022    3:07 PM  Fall Risk   Falls in the past year? 0    FALL RISK PREVENTION PERTAINING TO THE HOME:  Any stairs in or around the home? {YES/NO:21197} If so, are there any without handrails? {YES/NO:21197} Home free of loose throw rugs in walkways, pet beds, electrical cords, etc? {YES/NO:21197} Adequate lighting in your home to reduce risk of falls? {YES/NO:21197}  ASSISTIVE DEVICES UTILIZED TO PREVENT FALLS:  Life alert? {YES/NO:21197} Use of a cane, walker or w/c? {YES/NO:21197} Grab bars in the bathroom? {YES/NO:21197} Shower chair or bench in shower? {YES/NO:21197} Elevated toilet seat or a handicapped toilet? {YES/NO:21197}  TIMED UP AND GO:  Was the test performed? Unable to perform, virtual appointment   Cognitive Function:        Immunizations Immunization History  Administered Date(s) Administered   Td 02/27/1998   Tdap 02/23/2013    TDAP status: Up to date  Flu Vaccine status: Due, Education has been provided regarding the importance of this vaccine. Advised may receive this vaccine at local pharmacy or Health Dept. Aware to provide a copy of the vaccination record if obtained from local pharmacy or Health Dept. Verbalized acceptance and understanding.  Pneumococcal vaccine status: Due, Education has been provided regarding the importance of this vaccine. Advised may receive this vaccine at local pharmacy or Health Dept. Aware to provide a copy of the vaccination record if obtained from local pharmacy or Health Dept. Verbalized acceptance and understanding.  Covid-19 vaccine status: Information  provided on how to obtain vaccines.   Qualifies for Shingles Vaccine? Yes   Zostavax completed No   Shingrix Completed?: No.    Education has been provided regarding the importance of this vaccine. Patient has been advised to call insurance company to determine out of pocket expense if they have not yet received this vaccine. Advised may  also receive vaccine at local pharmacy or Health Dept. Verbalized acceptance and understanding.  Screening Tests Health Maintenance  Topic Date Due   Medicare Annual Wellness (AWV)  Never done   COVID-19 Vaccine (1) Never done   COLONOSCOPY (Pts 45-20yr Insurance coverage will need to be confirmed)  Never done   Zoster Vaccines- Shingrix (1 of 2) Never done   MAMMOGRAM  12/28/2008   Pneumonia Vaccine 69 Years old (1 of 1 - PCV) Never done   DEXA SCAN  Never done   INFLUENZA VACCINE  Never done   DTaP/Tdap/Td (3 - Td or Tdap) 02/24/2023   Hepatitis C Screening  Completed   HPV VACCINES  Aged Out    Health Maintenance  Health Maintenance Due  Topic Date Due   Medicare Annual Wellness (AWV)  Never done   COVID-19 Vaccine (1) Never done   COLONOSCOPY (Pts 45-470yrInsurance coverage will need to be confirmed)  Never done   Zoster Vaccines- Shingrix (1 of 2) Never done   MAMMOGRAM  12/28/2008   Pneumonia Vaccine 6516Years old (1 of 1 - PCV) Never done   DEXA SCAN  Never done   INFLUENZA VACCINE  Never done    Colorectal cancer screening: Referral to GI placed ***. Pt aware the office will call re: appt.  Mammogram status: Ordered ***. Pt provided with contact info and advised to call to schedule appt.   DEXA Scan:   Lung Cancer Screening: (Low Dose CT Chest recommended if Age 69-80ears, 30 pack-year currently smoking OR have quit w/in 15years.) does not qualify.   Lung Cancer Screening Referral: not applicable   Additional Screening:  Hepatitis C Screening: does qualify; Completed 02/18/2017  Vision Screening: Recommended annual  ophthalmology exams for early detection of glaucoma and other disorders of the eye. Is the patient up to date with their annual eye exam?  {YES/NO:21197} Who is the provider or what is the name of the office in which the patient attends annual eye exams? *** If pt is not established with a provider, would they like to be referred to a provider to establish care? {YES/NO:21197}.   Dental Screening: Recommended annual dental exams for proper oral hygiene  Community Resource Referral / Chronic Care Management: CRR required this visit?  No   CCM required this visit?  No      Plan:     I have personally reviewed and noted the following in the patient's chart:   Medical and social history Use of alcohol, tobacco or illicit drugs  Current medications and supplements including opioid prescriptions. Patient is not currently taking opioid prescriptions. Functional ability and status Nutritional status Physical activity Advanced directives List of other physicians Hospitalizations, surgeries, and ER visits in previous 12 months Vitals Screenings to include cognitive, depression, and falls Referrals and appointments  In addition, I have reviewed and discussed with patient certain preventive protocols, quality metrics, and best practice recommendations. A written personalized care plan for preventive services as well as general preventive health recommendations were provided to patient.    Ms. AlBarbuto Thank you for taking time to come for your Medicare Wellness Visit. I appreciate your ongoing commitment to your health goals. Please review the following plan we discussed and let me know if I can assist you in the future.   These are the goals we discussed:  Goals   None     This is a list of the screening recommended for you and due dates:  Health  Maintenance  Topic Date Due   COVID-19 Vaccine (1) Never done   Colon Cancer Screening  Never done   Zoster (Shingles) Vaccine (1 of  2) Never done   Mammogram  12/28/2008   Pneumonia Vaccine (1 of 1 - PCV) Never done   DEXA scan (bone density measurement)  Never done   Flu Shot  Never done   DTaP/Tdap/Td vaccine (3 - Td or Tdap) 02/24/2023   Medicare Annual Wellness Visit  01/11/2024   Hepatitis C Screening: USPSTF Recommendation to screen - Ages 18-79 yo.  Completed   HPV Vaccine  Aged [address], [place]   01/10/2023  Nurse Notes: Approximately 30 minute Non-Face -To-Face Medicare Wellness Visit

## 2023-01-10 ENCOUNTER — Ambulatory Visit (INDEPENDENT_AMBULATORY_CARE_PROVIDER_SITE_OTHER): Payer: Medicare Other

## 2023-01-10 DIAGNOSIS — Z0000 Encounter for general adult medical examination without abnormal findings: Secondary | ICD-10-CM

## 2023-01-17 ENCOUNTER — Telehealth: Payer: Self-pay | Admitting: Family Medicine

## 2023-01-17 NOTE — Telephone Encounter (Signed)
Called patient to schedule Medicare Annual Wellness Visit (AWV). Left message for patient to call back and schedule Medicare Annual Wellness Visit (AWV).  Last date of AWV: Hasn't had one  If patient calls back please forward message to [facility] for scheduling.  If any questions, please contact me at [phone].  Thank you ,  Morgan Moreno

## 2023-02-08 DIAGNOSIS — M5416 Radiculopathy, lumbar region: Secondary | ICD-10-CM | POA: Diagnosis not present

## 2023-02-15 DIAGNOSIS — M5416 Radiculopathy, lumbar region: Secondary | ICD-10-CM | POA: Insufficient documentation

## 2023-02-23 DIAGNOSIS — M545 Low back pain, unspecified: Secondary | ICD-10-CM | POA: Diagnosis not present

## 2023-03-02 DIAGNOSIS — M79605 Pain in left leg: Secondary | ICD-10-CM | POA: Diagnosis not present

## 2023-03-02 DIAGNOSIS — M5459 Other low back pain: Secondary | ICD-10-CM | POA: Diagnosis not present

## 2023-03-02 DIAGNOSIS — R262 Difficulty in walking, not elsewhere classified: Secondary | ICD-10-CM | POA: Diagnosis not present

## 2023-03-07 DIAGNOSIS — R262 Difficulty in walking, not elsewhere classified: Secondary | ICD-10-CM | POA: Diagnosis not present

## 2023-03-07 DIAGNOSIS — M5459 Other low back pain: Secondary | ICD-10-CM | POA: Diagnosis not present

## 2023-03-07 DIAGNOSIS — M79605 Pain in left leg: Secondary | ICD-10-CM | POA: Diagnosis not present

## 2023-03-09 DIAGNOSIS — M79605 Pain in left leg: Secondary | ICD-10-CM | POA: Diagnosis not present

## 2023-03-09 DIAGNOSIS — M5459 Other low back pain: Secondary | ICD-10-CM | POA: Diagnosis not present

## 2023-03-09 DIAGNOSIS — R262 Difficulty in walking, not elsewhere classified: Secondary | ICD-10-CM | POA: Diagnosis not present

## 2023-03-17 DIAGNOSIS — M5459 Other low back pain: Secondary | ICD-10-CM | POA: Diagnosis not present

## 2023-03-17 DIAGNOSIS — R262 Difficulty in walking, not elsewhere classified: Secondary | ICD-10-CM | POA: Diagnosis not present

## 2023-03-17 DIAGNOSIS — M79605 Pain in left leg: Secondary | ICD-10-CM | POA: Diagnosis not present

## 2023-03-30 DIAGNOSIS — M5451 Vertebrogenic low back pain: Secondary | ICD-10-CM | POA: Diagnosis not present

## 2023-04-02 DIAGNOSIS — M5416 Radiculopathy, lumbar region: Secondary | ICD-10-CM | POA: Diagnosis not present

## 2023-04-05 DIAGNOSIS — M5451 Vertebrogenic low back pain: Secondary | ICD-10-CM | POA: Diagnosis not present

## 2023-04-05 DIAGNOSIS — M5136 Other intervertebral disc degeneration, lumbar region: Secondary | ICD-10-CM | POA: Diagnosis not present

## 2023-05-09 ENCOUNTER — Ambulatory Visit: Payer: Medicare Other

## 2023-05-09 NOTE — Patient Instructions (Incomplete)
Morgan Moreno , Thank you for taking time to come for your Medicare Wellness Visit. I appreciate your ongoing commitment to your health goals. Please review the following plan we discussed and let me know if I can assist you in the future.   These are the goals we discussed:  Goals   None     This is a list of the screening recommended for you and due dates:  Health Maintenance  Topic Date Due   Medicare Annual Wellness Visit  Never done   COVID-19 Vaccine (1) Never done   Colon Cancer Screening  Never done   Zoster (Shingles) Vaccine (1 of 2) Never done   Mammogram  12/28/2008   Pneumonia Vaccine (1 of 1 - PCV) Never done   DEXA scan (bone density measurement)  Never done   DTaP/Tdap/Td vaccine (3 - Td or Tdap) 02/24/2023   Flu Shot  06/30/2023   Hepatitis C Screening  Completed   HPV Vaccine  Aged Out    Advanced directives: Information on Advanced Care Planning can be found at [facility] Advance Health Care Directives Advance Health Care Directives ([email or web address] Please bring a copy of your health care power of attorney and living will to the office to be added to your chart at your convenience.   Conditions/risks identified: Aim for 30 minutes of exercise or brisk walking, 6-8 glasses of water, and 5 servings of fruits and vegetables each day.   Next appointment: Follow up in one year for your annual wellness visit    Preventive Care 65 Years and Older, Female Preventive care refers to lifestyle choices and visits with your health care provider that can promote health and wellness. What does preventive care include? A yearly physical exam. This is also called an annual well check. Dental exams once or twice a year. Routine eye exams. Ask your health care provider how often you should have your eyes checked. Personal lifestyle choices, including: Daily care of your teeth and gums. Regular physical activity. Eating a healthy diet. Avoiding tobacco and  drug use. Limiting alcohol use. Practicing safe sex. Taking low-dose aspirin every day. Taking vitamin and mineral supplements as recommended by your health care provider. What happens during an annual well check? The services and screenings done by your health care provider during your annual well check will depend on your age, overall health, lifestyle risk factors, and family history of disease. Counseling  Your health care provider may ask you questions about your: Alcohol use. Tobacco use. Drug use. Emotional well-being. Home and relationship well-being. Sexual activity. Eating habits. History of falls. Memory and ability to understand (cognition). Work and work Astronomer. Reproductive health. Screening  You may have the following tests or measurements: Height, weight, and BMI. Blood pressure. Lipid and cholesterol levels. These may be checked every 5 years, or more frequently if you are over 68 years old. Skin check. Lung cancer screening. You may have this screening every year starting at age 14 if you have a 30-pack-year history of smoking and currently smoke or have quit within the past 15 years. Fecal occult blood test (FOBT) of the stool. You may have this test every year starting at age 82. Flexible sigmoidoscopy or colonoscopy. You may have a sigmoidoscopy every 5 years or a colonoscopy every 10 years starting at age 27. Hepatitis C blood test. Hepatitis B blood test. Sexually transmitted disease (STD) testing. Diabetes screening. This is done by checking your blood sugar (glucose) after you have  not eaten for a while (fasting). You may have this done every 1-3 years. Bone density scan. This is done to screen for osteoporosis. You may have this done starting at age 68. Mammogram. This may be done every 1-2 years. Talk to your health care provider about how often you should have regular mammograms. Talk with your health care provider about your test results, treatment  options, and if necessary, the need for more tests. Vaccines  Your health care provider may recommend certain vaccines, such as: Influenza vaccine. This is recommended every year. Tetanus, diphtheria, and acellular pertussis (Tdap, Td) vaccine. You may need a Td booster every 10 years. Zoster vaccine. You may need this after age 75. Pneumococcal 13-valent conjugate (PCV13) vaccine. One dose is recommended after age 44. Pneumococcal polysaccharide (PPSV23) vaccine. One dose is recommended after age 40. Talk to your health care provider about which screenings and vaccines you need and how often you need them. This information is not intended to replace advice given to you by your health care provider. Make sure you discuss any questions you have with your health care provider. Document Released: 12/12/2015 Document Revised: 08/04/2016 Document Reviewed: 09/16/2015 Elsevier Interactive Patient Education  2017 [place] Prevention in the Home Falls can cause injuries. They can happen to people of all ages. There are many things you can do to make your home safe and to help prevent falls. What can I do on the outside of my home? Regularly fix the edges of walkways and driveways and fix any cracks. Remove anything that might make you trip as you walk through a door, such as a raised step or threshold. Trim any bushes or trees on the path to your home. Use bright outdoor lighting. Clear any walking paths of anything that might make someone trip, such as rocks or tools. Regularly check to see if handrails are loose or broken. Make sure that both sides of any steps have handrails. Any raised decks and porches should have guardrails on the edges. Have any leaves, snow, or ice cleared regularly. Use sand or salt on walking paths during winter. Clean up any spills in your garage right away. This includes oil or grease spills. What can I do in the bathroom? Use night lights. Install grab  bars by the toilet and in the tub and shower. Do not use towel bars as grab bars. Use non-skid mats or decals in the tub or shower. If you need to sit down in the shower, use a plastic, non-slip stool. Keep the floor dry. Clean up any water that spills on the floor as soon as it happens. Remove soap buildup in the tub or shower regularly. Attach bath mats securely with double-sided non-slip rug tape. Do not have throw rugs and other things on the floor that can make you trip. What can I do in the bedroom? Use night lights. Make sure that you have a light by your bed that is easy to reach. Do not use any sheets or blankets that are too big for your bed. They should not hang down onto the floor. Have a firm chair that has side arms. You can use this for support while you get dressed. Do not have throw rugs and other things on the floor that can make you trip. What can I do in the kitchen? Clean up any spills right away. Avoid walking on wet floors. Keep items that you use a lot in easy-to-reach places. If you need to  reach something above you, use a strong step stool that has a grab bar. Keep electrical cords out of the way. Do not use floor polish or wax that makes floors slippery. If you must use wax, use non-skid floor wax. Do not have throw rugs and other things on the floor that can make you trip. What can I do with my stairs? Do not leave any items on the stairs. Make sure that there are handrails on both sides of the stairs and use them. Fix handrails that are broken or loose. Make sure that handrails are as long as the stairways. Check any carpeting to make sure that it is firmly attached to the stairs. Fix any carpet that is loose or worn. Avoid having throw rugs at the top or bottom of the stairs. If you do have throw rugs, attach them to the floor with carpet tape. Make sure that you have a light switch at the top of the stairs and the bottom of the stairs. If you do not have them,  ask someone to add them for you. What else can I do to help prevent falls? Wear shoes that: Do not have high heels. Have rubber bottoms. Are comfortable and fit you well. Are closed at the toe. Do not wear sandals. If you use a stepladder: Make sure that it is fully opened. Do not climb a closed stepladder. Make sure that both sides of the stepladder are locked into place. Ask someone to hold it for you, if possible. Clearly mark and make sure that you can see: Any grab bars or handrails. First and last steps. Where the edge of each step is. Use tools that help you move around (mobility aids) if they are needed. These include: Canes. Walkers. Scooters. Crutches. Turn on the lights when you go into a dark area. Replace any light bulbs as soon as they burn out. Set up your furniture so you have a clear path. Avoid moving your furniture around. If any of your floors are uneven, fix them. If there are any pets around you, be aware of where they are. Review your medicines with your doctor. Some medicines can make you feel dizzy. This can increase your chance of falling. Ask your doctor what other things that you can do to help prevent falls. This information is not intended to replace advice given to you by your health care provider. Make sure you discuss any questions you have with your health care provider. Document Released: 09/11/2009 Document Revised: 04/22/2016 Document Reviewed: 12/20/2014 Elsevier Interactive Patient Education  2017 [employer].

## 2023-05-09 NOTE — Progress Notes (Unsigned)
Subjective:   Morgan Moreno is a 67 y.o. female who presents for an Initial Medicare Annual Wellness Visit.  Review of Systems    ***       Objective:    There were no vitals filed for this visit. There is no height or weight on file to calculate BMI.     06/18/2022    3:07 PM 03/04/2017    2:09 PM 02/11/2017    1:50 PM  Advanced Directives  Does Patient Have a Medical Advance Directive? No No No  Would patient like information on creating a medical advance directive?  No - Patient declined No - Patient declined    Current Medications (verified) Outpatient Encounter Medications as of 05/09/2023  Medication Sig   loratadine (CLARITIN) 10 MG tablet Take 10 mg by mouth daily.     naproxen sodium (ANAPROX) 220 MG tablet Take 220 mg by mouth 2 (two) times daily as needed (pain).   omeprazole (PRILOSEC) 20 MG capsule Take 20 mg by mouth daily.     Polyethyl Glycol-Propyl Glycol (SYSTANE OP) Apply 2 drops to eye 3 (three) times daily as needed (allergy relief).   No facility-administered encounter medications on file as of 05/09/2023.    Allergies (verified) Sulfonamide derivatives and Oxycodone-acetaminophen   History: Past Medical History:  Diagnosis Date   Allergy    Anemia    "a long time ago," 13+ years ago as of 2014   Arthritis    Goiter 01/26/2007   Qualifier: Diagnosis of  By: Seleta Rhymes MD, Mark     Sickle cell anemia (HCC)    ?trait   Past Surgical History:  Procedure Laterality Date   ABDOMINAL HYSTERECTOMY     At age 69, for fibroids/bleeding   CATARACT EXTRACTION Left 02/05/14   Removed by Dr. Elmer Picker in [place]   FOOT SURGERY Right    "Metatarsal bone dropped"   HAND SURGERY Right    "Replaced cartilage"   Family History  Problem Relation Age of Onset   Kidney disease Mother    Diabetes Mother    Hypertension Mother    Stroke Mother    Mental illness Father        schizophrenic   Asthma Sister    Asthma Brother    Cancer Paternal Uncle     Social History   Socioeconomic History   Marital status: Single    Spouse name: Not on file   Number of children: Not on file   Years of education: Not on file   Highest education level: Not on file  Occupational History   Not on file  Tobacco Use   Smoking status: Never   Smokeless tobacco: Never  Substance and Sexual Activity   Alcohol use: Yes    Comment: "maybe two drinks a year"   Drug use: No   Sexual activity: Never  Other Topics Concern   Not on file  Social History Narrative   Not on file   Social Determinants of Health   Financial Resource Strain: Not on file  Food Insecurity: Not on file  Transportation Needs: Not on file  Physical Activity: Not on file  Stress: Not on file  Social Connections: Not on file    Tobacco Counseling Counseling given: Not Answered   Clinical Intake:                 Diabetic?No          Activities of Daily Living     No  data to display          Patient Care Team: Lockie Mola, MD as PCP - General (Family Medicine)  Indicate any recent Medical Services you may have received from other than [facility] providers in the past year (date may be approximate).     Assessment:   This is a routine wellness examination for [name].  Hearing/Vision screen No results found.  Dietary issues and exercise activities discussed:     Goals Addressed   None    Depression Screen    06/18/2022    3:08 PM 03/04/2017    2:09 PM  PHQ 2/9 Scores  PHQ - 2 Score 0 0  PHQ- 9 Score 2 0    Fall Risk    06/18/2022    3:07 PM  Fall Risk   Falls in the past year? 0    FALL RISK PREVENTION PERTAINING TO THE HOME:  Any stairs in or around the home? {YES/NO:21197} If so, are there any without handrails? {YES/NO:21197} Home free of loose throw rugs in walkways, pet beds, electrical cords, etc? {YES/NO:21197} Adequate lighting in your home to reduce risk of falls? {YES/NO:21197}  ASSISTIVE DEVICES UTILIZED TO PREVENT  FALLS:  Life alert? {YES/NO:21197} Use of a cane, walker or w/c? {YES/NO:21197} Grab bars in the bathroom? {YES/NO:21197} Shower chair or bench in shower? {YES/NO:21197} Elevated toilet seat or a handicapped toilet? {YES/NO:21197}  TIMED UP AND GO:  Was the test performed? No . Telephonic visit   Cognitive Function:        Immunizations Immunization History  Administered Date(s) Administered   Td 02/27/1998   Tdap 02/23/2013    {TDAP status:2101805}  {Pneumococcal vaccine status:2101807}  Covid-19 vaccine status: Information provided on how to obtain vaccines.   Qualifies for Shingles Vaccine? Yes   Zostavax completed No   Shingrix Completed?: No.    Education has been provided regarding the importance of this vaccine. Patient has been advised to call insurance company to determine out of pocket expense if they have not yet received this vaccine. Advised may also receive vaccine at local pharmacy or Health Dept. Verbalized acceptance and understanding.  Screening Tests Health Maintenance  Topic Date Due   Medicare Annual Wellness (AWV)  Never done   COVID-19 Vaccine (1) Never done   Colonoscopy  Never done   Zoster Vaccines- Shingrix (1 of 2) Never done   MAMMOGRAM  12/28/2008   Pneumonia Vaccine 63+ Years old (1 of 1 - PCV) Never done   DEXA SCAN  Never done   DTaP/Tdap/Td (3 - Td or Tdap) 02/24/2023   INFLUENZA VACCINE  06/30/2023   Hepatitis C Screening  Completed   HPV VACCINES  Aged Out    Health Maintenance  Health Maintenance Due  Topic Date Due   Medicare Annual Wellness (AWV)  Never done   COVID-19 Vaccine (1) Never done   Colonoscopy  Never done   Zoster Vaccines- Shingrix (1 of 2) Never done   MAMMOGRAM  12/28/2008   Pneumonia Vaccine 94+ Years old (1 of 1 - PCV) Never done   DEXA SCAN  Never done   DTaP/Tdap/Td (3 - Td or Tdap) 02/24/2023    {Colorectal cancer screening:2101809}  {Mammogram status:21018020}  {Bone Density  status:21018021}  Lung Cancer Screening: (Low Dose CT Chest recommended if Age 62-80 years, 30 pack-year currently smoking OR have quit w/in 15years.) does not qualify.   Lung Cancer Screening Referral: n/a  Additional Screening:  Hepatitis C Screening: does qualify; Completed 02/18/17  Vision Screening: Recommended annual ophthalmology exams for early detection of glaucoma and other disorders of the eye. Is the patient up to date with their annual eye exam?  {YES/NO:21197} Who is the provider or what is the name of the office in which the patient attends annual eye exams? *** If pt is not established with a provider, would they like to be referred to a provider to establish care? {YES/NO:21197}.   Dental Screening: Recommended annual dental exams for proper oral hygiene  Community Resource Referral / Chronic Care Management: CRR required this visit?  {YES/NO:21197}  CCM required this visit?  {YES/NO:21197}     Plan:     I have personally reviewed and noted the following in the patient's chart:   Medical and social history Use of alcohol, tobacco or illicit drugs  Current medications and supplements including opioid prescriptions. {Opioid Prescriptions:[phone]} Functional ability and status Nutritional status Physical activity Advanced directives List of other physicians Hospitalizations, surgeries, and ER visits in previous 12 months Vitals Screenings to include cognitive, depression, and falls Referrals and appointments  In addition, I have reviewed and discussed with patient certain preventive protocols, quality metrics, and best practice recommendations. A written personalized care plan for preventive services as well as general preventive health recommendations were provided to patient.     Durwin Nora, [place]   1/61/0960   Due to this being a virtual visit, the after visit summary with patients personalized plan was offered to patient via mail or  my-chart. ***Patient declined at this time./ Patient would like to access on my-chart/ per request, patient was mailed a copy of AVS./ Patient preferred to pick up at office at next visit  Nurse Notes: ***

## 2023-05-10 DIAGNOSIS — M5451 Vertebrogenic low back pain: Secondary | ICD-10-CM | POA: Diagnosis not present

## 2023-05-10 DIAGNOSIS — M519 Unspecified thoracic, thoracolumbar and lumbosacral intervertebral disc disorder: Secondary | ICD-10-CM | POA: Diagnosis not present

## 2023-05-10 DIAGNOSIS — M5136 Other intervertebral disc degeneration, lumbar region: Secondary | ICD-10-CM | POA: Diagnosis not present

## 2023-06-08 ENCOUNTER — Ambulatory Visit: Payer: Medicare Other

## 2023-06-08 VITALS — BP 158/76 | HR 80 | Ht 66.0 in | Wt 157.2 lb

## 2023-06-08 DIAGNOSIS — L821 Other seborrheic keratosis: Secondary | ICD-10-CM

## 2023-06-08 NOTE — Assessment & Plan Note (Addendum)
Exam and history consistent with seborrheic keratoses on abdomen, irritated by clothing. Patient requesting removal. Opted for cryotherapy treatment, however clinic is out of stock. Patient scheduled in 2 weeks for cryotherapy. -Cryotherapy in 2 weeks

## 2023-06-08 NOTE — Patient Instructions (Addendum)
It was great to see you! Thank you for allowing me to participate in your care!   I recommend that you always bring your medications to each appointment as this makes it easy to ensure we are on the correct medications and helps [place] not miss when refills are needed.  Our plans for today:  - Follow-up in 2 weeks July 24 at 8:30 AM for cryotherapy  Take care and seek immediate care sooner if you develop any concerns. Please remember to show up 15 minutes before your scheduled appointment time!  Tiffany Kocher, DO [facility]

## 2023-06-08 NOTE — Progress Notes (Signed)
    SUBJECTIVE:   CHIEF COMPLAINT / HPI:  Moles 2 lesions present months, not painful, no drainage, no growth. Wears sunscreen, but lesions not on sun exposed skin. No cancer history. Not on blood thinners.   OBJECTIVE:   BP (!) 158/76   Pulse 80   Ht 5\' 6"  (1.676 m)   Wt 157 lb 3.2 oz (71.3 kg)   SpO2 100%   BMI 25.37 kg/m    General: NAD, pleasant Respiratory: normal wob on RA Skin: Two 3x3 mm hyperpigmented, raised, and keratotic nevi on abdomen consistent with seborrheic keratosis.  ASSESSMENT/PLAN:   Patient was offered cryotherapy. Unfortunately clinic was out of stock, and this led to unnecessary wait time for patient. As we are not able to treat today and after discussion with attending physician, this will be billed as no charge. Patient will follow-up for treatment and re-assessment in 2 weeks.  Seborrheic keratoses Exam and history consistent with seborrheic keratoses on abdomen, irritated by clothing. Patient requesting removal. Opted for cryotherapy treatment, however clinic is out of stock. Patient scheduled in 2 weeks for cryotherapy. -Cryotherapy in 2 weeks   Tiffany Kocher, DO [facility]

## 2023-06-22 ENCOUNTER — Ambulatory Visit: Payer: Medicare Other | Admitting: Student

## 2023-06-22 VITALS — BP 137/80 | HR 82 | Ht 66.0 in | Wt 158.0 lb

## 2023-06-22 DIAGNOSIS — L821 Other seborrheic keratosis: Secondary | ICD-10-CM | POA: Diagnosis not present

## 2023-06-22 NOTE — Progress Notes (Signed)
    SUBJECTIVE:   CHIEF COMPLAINT / HPI:   Patient present for Cryotherapy of symptomatic Keratoses. They are very pruritic and irritating.   OBJECTIVE:   BP 137/80   Pulse 82   Ht 5\' 6"  (1.676 m)   Wt 158 lb (71.7 kg)   SpO2 97%   BMI 25.50 kg/m    General: NAD, well-appearing, well-nourished Respiratory: No respiratory distress, breathing comfortably, able to speak in full sentences Skin: warm and dry, two seborrheic keratoses on abdomen. Psych: Appropriate affect and mood   ASSESSMENT/PLAN:   Procedure note Diagnosis: Seborrheic Keratosis Procedure: Cryotherapy Location: Abdomen  After discussion of the risks, benefits, and alternative therapies available, the patient elected to proceed. After obtaining written informed consent, the patient's identity, procedure, and site were verified during a time out prior to proceeding procedure. The lesions on the abdomen were treated using liquid nitrogen spray gun for 6 second per cycle, 3 cycles total. The patient tolerated the procedure well and there were no immediate complications.  Patient was provided aftercare handout and advised to return if lesion(s) did not fully resolved.  Seborrheic keratoses Symptomatic Seborrheic Keratoses. One on anterior abdomen, one on right lateral abdomen. -Cryotherapy today, see note above   Tiffany Kocher, DO [facility]

## 2023-06-22 NOTE — Assessment & Plan Note (Signed)
Symptomatic Seborrheic Keratoses. One on anterior abdomen, one on right lateral abdomen. -Cryotherapy today, see note above

## 2023-06-22 NOTE — Patient Instructions (Addendum)
It was great to see you! Thank you for allowing me to participate in your care!   Discolouration of the skin is very common after cryotherapy. It's not always avoidable, but to reduce the risk, we recommend simple steps to seal the wound and encourage healing.  Dressings If there are no sores, blisters or scabs in the treated area, no dressing is required.  If your treated area develops a sore or a blister, cover it with a Band-Aid or similar adhesive dressing for 3 days. If you don't like the appearance of the treated area while it heals, you can cover it with a further dressing but this is optional.  Blisters Keep blisters covered with Band-Aid. Do not puncture blisters. Do not use creams on blisters, until they have healed and scabbed over.  Healing If the skin is intact, we recommend normal moisturizers and use sunscreen while outside.   Take care and seek immediate care sooner if you develop any concerns. Please remember to show up 15 minutes before your scheduled appointment time!  Tiffany Kocher, DO [facility]

## 2023-08-11 DIAGNOSIS — J302 Other seasonal allergic rhinitis: Secondary | ICD-10-CM | POA: Diagnosis not present

## 2023-08-11 DIAGNOSIS — Z20822 Contact with and (suspected) exposure to covid-19: Secondary | ICD-10-CM | POA: Diagnosis not present

## 2023-08-15 ENCOUNTER — Ambulatory Visit: Payer: Medicare Other

## 2023-08-23 ENCOUNTER — Ambulatory Visit: Payer: Medicare Other

## 2023-08-23 DIAGNOSIS — Z0000 Encounter for general adult medical examination without abnormal findings: Secondary | ICD-10-CM

## 2023-08-23 NOTE — Patient Instructions (Signed)
Morgan Moreno , Thank you for taking time to come for your Medicare Wellness Visit. I appreciate your ongoing commitment to your health goals. Please review the following plan we discussed and let me know if I can assist you in the future.   Screening recommendations/referrals: Colonoscopy: Education provided Mammogram: Education provided Bone Density: Education provided Recommended yearly ophthalmology/optometry visit for glaucoma screening and checkup Recommended yearly dental visit for hygiene and checkup  Vaccinations: Influenza vaccine: Education provided Pneumococcal vaccine: Education provided Tdap vaccine: Education provided Shingles vaccine: Education provided    Advanced directives: Education provided     Preventive Care 65 Years and Older, Female Preventive care refers to lifestyle choices and visits with your health care provider that can promote health and wellness. What does preventive care include? A yearly physical exam. This is also called an annual well check. Dental exams once or twice a year. Routine eye exams. Ask your health care provider how often you should have your eyes checked. Personal lifestyle choices, including: Daily care of your teeth and gums. Regular physical activity. Eating a healthy diet. Avoiding tobacco and drug use. Limiting alcohol use. Practicing safe sex. Taking low-dose aspirin every day. Taking vitamin and mineral supplements as recommended by your health care provider. What happens during an annual well check? The services and screenings done by your health care provider during your annual well check will depend on your age, overall health, lifestyle risk factors, and family history of disease. Counseling  Your health care provider may ask you questions about your: Alcohol use. Tobacco use. Drug use. Emotional well-being. Home and relationship well-being. Sexual activity. Eating habits. History of falls. Memory and ability  to understand (cognition). Work and work Astronomer. Reproductive health. Screening  You may have the following tests or measurements: Height, weight, and BMI. Blood pressure. Lipid and cholesterol levels. These may be checked every 5 years, or more frequently if you are over 18 years old. Skin check. Lung cancer screening. You may have this screening every year starting at age 5 if you have a 30-pack-year history of smoking and currently smoke or have quit within the past 15 years. Fecal occult blood test (FOBT) of the stool. You may have this test every year starting at age 65. Flexible sigmoidoscopy or colonoscopy. You may have a sigmoidoscopy every 5 years or a colonoscopy every 10 years starting at age 7. Hepatitis C blood test. Hepatitis B blood test. Sexually transmitted disease (STD) testing. Diabetes screening. This is done by checking your blood sugar (glucose) after you have not eaten for a while (fasting). You may have this done every 1-3 years. Bone density scan. This is done to screen for osteoporosis. You may have this done starting at age 70. Mammogram. This may be done every 1-2 years. Talk to your health care provider about how often you should have regular mammograms. Talk with your health care provider about your test results, treatment options, and if necessary, the need for more tests. Vaccines  Your health care provider may recommend certain vaccines, such as: Influenza vaccine. This is recommended every year. Tetanus, diphtheria, and acellular pertussis (Tdap, Td) vaccine. You may need a Td booster every 10 years. Zoster vaccine. You may need this after age 65. Pneumococcal 13-valent conjugate (PCV13) vaccine. One dose is recommended after age 67. Pneumococcal polysaccharide (PPSV23) vaccine. One dose is recommended after age 75. Talk to your health care provider about which screenings and vaccines you need and how often you need them. This information  is not  intended to replace advice given to you by your health care provider. Make sure you discuss any questions you have with your health care provider. Document Released: 12/12/2015 Document Revised: 08/04/2016 Document Reviewed: 09/16/2015 Elsevier Interactive Patient Education  2017 [employer].  Fall Prevention in the Home Falls can cause injuries. They can happen to people of all ages. There are many things you can do to make your home safe and to help prevent falls. What can I do on the outside of my home? Regularly fix the edges of walkways and driveways and fix any cracks. Remove anything that might make you trip as you walk through a door, such as a raised step or threshold. Trim any bushes or trees on the path to your home. Use bright outdoor lighting. Clear any walking paths of anything that might make someone trip, such as rocks or tools. Regularly check to see if handrails are loose or broken. Make sure that both sides of any steps have handrails. Any raised decks and porches should have guardrails on the edges. Have any leaves, snow, or ice cleared regularly. Use sand or salt on walking paths during winter. Clean up any spills in your garage right away. This includes oil or grease spills. What can I do in the bathroom? Use night lights. Install grab bars by the toilet and in the tub and shower. Do not use towel bars as grab bars. Use non-skid mats or decals in the tub or shower. If you need to sit down in the shower, use a plastic, non-slip stool. Keep the floor dry. Clean up any water that spills on the floor as soon as it happens. Remove soap buildup in the tub or shower regularly. Attach bath mats securely with double-sided non-slip rug tape. Do not have throw rugs and other things on the floor that can make you trip. What can I do in the bedroom? Use night lights. Make sure that you have a light by your bed that is easy to reach. Do not use any sheets or blankets that are  too big for your bed. They should not hang down onto the floor. Have a firm chair that has side arms. You can use this for support while you get dressed. Do not have throw rugs and other things on the floor that can make you trip. What can I do in the kitchen? Clean up any spills right away. Avoid walking on wet floors. Keep items that you use a lot in easy-to-reach places. If you need to reach something above you, use a strong step stool that has a grab bar. Keep electrical cords out of the way. Do not use floor polish or wax that makes floors slippery. If you must use wax, use non-skid floor wax. Do not have throw rugs and other things on the floor that can make you trip. What can I do with my stairs? Do not leave any items on the stairs. Make sure that there are handrails on both sides of the stairs and use them. Fix handrails that are broken or loose. Make sure that handrails are as long as the stairways. Check any carpeting to make sure that it is firmly attached to the stairs. Fix any carpet that is loose or worn. Avoid having throw rugs at the top or bottom of the stairs. If you do have throw rugs, attach them to the floor with carpet tape. Make sure that you have a light switch at the top of  the stairs and the bottom of the stairs. If you do not have them, ask someone to add them for you. What else can I do to help prevent falls? Wear shoes that: Do not have high heels. Have rubber bottoms. Are comfortable and fit you well. Are closed at the toe. Do not wear sandals. If you use a stepladder: Make sure that it is fully opened. Do not climb a closed stepladder. Make sure that both sides of the stepladder are locked into place. Ask someone to hold it for you, if possible. Clearly mark and make sure that you can see: Any grab bars or handrails. First and last steps. Where the edge of each step is. Use tools that help you move around (mobility aids) if they are needed. These  include: Canes. Walkers. Scooters. Crutches. Turn on the lights when you go into a dark area. Replace any light bulbs as soon as they burn out. Set up your furniture so you have a clear path. Avoid moving your furniture around. If any of your floors are uneven, fix them. If there are any pets around you, be aware of where they are. Review your medicines with your doctor. Some medicines can make you feel dizzy. This can increase your chance of falling. Ask your doctor what other things that you can do to help prevent falls. This information is not intended to replace advice given to you by your health care provider. Make sure you discuss any questions you have with your health care provider. Document Released: 09/11/2009 Document Revised: 04/22/2016 Document Reviewed: 12/20/2014 Elsevier Interactive Patient Education  2017 [employer].

## 2023-08-23 NOTE — Progress Notes (Signed)
Subjective:   Morgan Moreno is a 67 y.o. female who presents for an Initial Medicare Annual Wellness Visit.  Visit Complete: Virtual  I connected with  Morgan Moreno on 08/23/23 by a audio enabled telemedicine application and verified that I am speaking with the correct person using two identifiers.  Patient Location: Home  Provider Location: Home Office  I discussed the limitations of evaluation and management by telemedicine. The patient expressed understanding and agreed to proceed.  Vital Signs: Unable to obtain new vitals due to this being a telehealth visit.   Cardiac Risk Factors include: advanced age (>48men, >11 women)     Objective:    There were no vitals filed for this visit. There is no height or weight on file to calculate BMI.     08/23/2023    9:08 AM 06/22/2023    8:38 AM 06/18/2022    3:07 PM 03/04/2017    2:09 PM 02/11/2017    1:50 PM  Advanced Directives  Does Patient Have a Medical Advance Directive? No No No No No  Would patient like information on creating a medical advance directive? No - Patient declined   No - Patient declined No - Patient declined    Current Medications (verified) Outpatient Encounter Medications as of 08/23/2023  Medication Sig   fluticasone (FLONASE) 50 MCG/ACT nasal spray Place 1 spray into both nostrils daily.   naproxen sodium (ANAPROX) 220 MG tablet Take 220 mg by mouth 2 (two) times daily as needed (pain).   omeprazole (PRILOSEC) 20 MG capsule Take 20 mg by mouth daily.     Polyethyl Glycol-Propyl Glycol (SYSTANE OP) Apply 2 drops to eye 3 (three) times daily as needed (allergy relief).   loratadine (CLARITIN) 10 MG tablet Take 10 mg by mouth daily.   (Patient not taking: Reported on 08/23/2023)   No facility-administered encounter medications on file as of 08/23/2023.    Allergies (verified) Sulfonamide derivatives and Oxycodone-acetaminophen   History: Past Medical History:  Diagnosis Date   Allergy     Anemia    "a long time ago," 13+ years ago as of 2014   Arthritis    Goiter 01/26/2007   Qualifier: Diagnosis of  By: Seleta Rhymes MD, Mark     Sickle cell anemia (HCC)    ?trait   Past Surgical History:  Procedure Laterality Date   ABDOMINAL HYSTERECTOMY     At age 10, for fibroids/bleeding   CATARACT EXTRACTION Left 02/05/14   Removed by Dr. Elmer Picker in [place]   FOOT SURGERY Right    "Metatarsal bone dropped"   HAND SURGERY Right    "Replaced cartilage"   Family History  Problem Relation Age of Onset   Kidney disease Mother    Diabetes Mother    Hypertension Mother    Stroke Mother    Mental illness Father        schizophrenic   Asthma Sister    Asthma Brother    Cancer Paternal Uncle    Social History   Socioeconomic History   Marital status: Single    Spouse name: Not on file   Number of children: Not on file   Years of education: Not on file   Highest education level: Not on file  Occupational History   Not on file  Tobacco Use   Smoking status: Never   Smokeless tobacco: Never  Substance and Sexual Activity   Alcohol use: Yes    Comment: "maybe two drinks a year"  Drug use: No   Sexual activity: Never  Other Topics Concern   Not on file  Social History Narrative   Not on file   Social Determinants of Health   Financial Resource Strain: Low Risk  (08/23/2023)   Overall Financial Resource Strain (CARDIA)    Difficulty of Paying Living Expenses: Not hard at all  Food Insecurity: No Food Insecurity (08/23/2023)   Hunger Vital Sign    Worried About Running Out of Food in the Last Year: Never true    Ran Out of Food in the Last Year: Never true  Transportation Needs: No Transportation Needs (08/23/2023)   PRAPARE - Administrator, Civil Service (Medical): No    Lack of Transportation (Non-Medical): No  Physical Activity: Insufficiently Active (08/23/2023)   Exercise Vital Sign    Days of Exercise per Week: 3 days    Minutes of Exercise per  Session: 30 min  Stress: No Stress Concern Present (08/23/2023)   [employer] of Occupational Health - Occupational Stress Questionnaire    Feeling of Stress : Not at all  Social Connections: Unknown (08/23/2023)   Social Connection and Isolation Panel [NHANES]    Frequency of Communication with Friends and Family: More than three times a week    Frequency of Social Gatherings with Friends and Family: More than three times a week    Attends Religious Services: More than 4 times per year    Active Member of [employer] or Organizations: Yes    Attends Engineer, structural: More than 4 times per year    Marital Status: Not on file    Tobacco Counseling Counseling given: Not Answered   Clinical Intake:  Pre-visit preparation completed: Yes  Pain : No/denies pain     Diabetes: No  How often do you need to have someone help you when you read instructions, pamphlets, or other written materials from your doctor or pharmacy?: 1 - Never  Interpreter Needed?: No  Information entered by :: Remi Haggard LPN   Activities of Daily Living    08/23/2023    9:10 AM  In your present state of health, do you have any difficulty performing the following activities:  Hearing? 0  Vision? 0  Difficulty concentrating or making decisions? 0  Walking or climbing stairs? 0  Dressing or bathing? 0  Doing errands, shopping? 0  Preparing Food and eating ? N  Using the Toilet? N  In the past six months, have you accidently leaked urine? N  Do you have problems with loss of bowel control? N  Managing your Medications? N  Managing your Finances? N  Housekeeping or managing your Housekeeping? N    Patient Care Team: Lockie Mola, MD as PCP - General (Family Medicine)  Indicate any recent Medical Services you may have received from other than [facility] providers in the past year (date may be approximate).     Assessment:   This is a routine wellness examination for  Morgan Moreno.  Hearing/Vision screen Hearing Screening - Comments:: No trouble hearing Vision Screening - Comments:: Not up to date   Goals Addressed             This Visit's Progress    Increase physical activity         Depression Screen    08/23/2023    9:12 AM 06/18/2022    3:08 PM 03/04/2017    2:09 PM  PHQ 2/9 Scores  PHQ - 2 Score 0 0 0  PHQ- 9 Score 2 2 0    Fall Risk    08/23/2023    9:05 AM 06/18/2022    3:07 PM  Fall Risk   Falls in the past year? 1 0  Number falls in past yr: 0   Follow up Falls evaluation completed;Education provided;Falls prevention discussed     MEDICARE RISK AT HOME: Medicare Risk at Home Any stairs in or around the home?: No If so, are there any without handrails?: No Home free of loose throw rugs in walkways, pet beds, electrical cords, etc?: Yes Adequate lighting in your home to reduce risk of falls?: Yes Life alert?: No Use of a cane, walker or w/c?: No Grab bars in the bathroom?: Yes Shower chair or bench in shower?: No Elevated toilet seat or a handicapped toilet?: No  TIMED UP AND GO:  Was the test performed? No    Cognitive Function:        08/23/2023    9:10 AM  6CIT Screen  What Year? 0 points  What month? 0 points  What time? 0 points  Count back from 20 0 points  Months in reverse 0 points  Repeat phrase 0 points  Total Score 0 points    Immunizations Immunization History  Administered Date(s) Administered   Td 02/27/1998   Tdap 02/23/2013    TDAP status: Due, Education has been provided regarding the importance of this vaccine. Advised may receive this vaccine at local pharmacy or Health Dept. Aware to provide a copy of the vaccination record if obtained from local pharmacy or Health Dept. Verbalized acceptance and understanding.  Flu Vaccine status: Due, Education has been provided regarding the importance of this vaccine. Advised may receive this vaccine at local pharmacy or Health Dept. Aware to provide  a copy of the vaccination record if obtained from local pharmacy or Health Dept. Verbalized acceptance and understanding.  Pneumococcal vaccine status: Due, Education has been provided regarding the importance of this vaccine. Advised may receive this vaccine at local pharmacy or Health Dept. Aware to provide a copy of the vaccination record if obtained from local pharmacy or Health Dept. Verbalized acceptance and understanding.  Covid-19 vaccine status: Information provided on how to obtain vaccines.   Qualifies for Shingles Vaccine? Yes   Zostavax completed No   Shingrix Completed?: No.    Education has been provided regarding the importance of this vaccine. Patient has been advised to call insurance company to determine out of pocket expense if they have not yet received this vaccine. Advised may also receive vaccine at local pharmacy or Health Dept. Verbalized acceptance and understanding.  Screening Tests Health Maintenance  Topic Date Due   Zoster Vaccines- Shingrix (1 of 2) 08/23/2023 (Originally 05/17/1975)   COVID-19 Vaccine (1) 09/08/2023 (Originally 05/18/1961)   Colonoscopy  09/22/2023 (Originally 05/18/1999)   Pneumonia Vaccine 29+ Years old (1 of 1 - PCV) 12/21/2023 (Originally 05/18/2019)   MAMMOGRAM  02/20/2024 (Originally 12/28/2008)   INFLUENZA VACCINE  02/27/2024 (Originally 06/30/2023)   DEXA SCAN  08/22/2024 (Originally 05/18/2019)   Medicare Annual Wellness (AWV)  08/22/2024   Hepatitis C Screening  Completed   HPV VACCINES  Aged Out   DTaP/Tdap/Td  Discontinued    Health Maintenance  There are no preventive care reminders to display for this patient.   Colonoscopy  patient will call [employer] from [employer] never received her Cologuard Kit  Mammogram   Education provided   Bone Density Education provided  no order at this  time  Lung Cancer Screening: (Low Dose CT Chest recommended if Age 28-80 years, 20 pack-year currently smoking OR have quit w/in  15years.) does not qualify.   Lung Cancer Screening Referral:   Additional Screening:  Hepatitis C Screening: does not qualify; Completed 2018  Vision Screening: Recommended annual ophthalmology exams for early detection of glaucoma and other disorders of the eye. Is the patient up to date with their annual eye exam?  No  Who is the provider or what is the name of the office in which the patient attends annual eye exams? Education provided .[facility]   Information given for providers If pt is not established with a provider, would they like to be referred to a provider to establish care? No .   Dental Screening: Recommended annual dental exams for proper oral hygiene    Community Resource Referral / Chronic Care Management: CRR required this visit?  No   CCM required this visit?  No     Plan:     I have personally reviewed and noted the following in the patient's chart:   Medical and social history Use of alcohol, tobacco or illicit drugs  Current medications and supplements including opioid prescriptions. Patient is not currently taking opioid prescriptions. Functional ability and status Nutritional status Physical activity Advanced directives List of other physicians Hospitalizations, surgeries, and ER visits in previous 12 months Vitals Screenings to include cognitive, depression, and falls Referrals and appointments  In addition, I have reviewed and discussed with patient certain preventive protocols, quality metrics, and best practice recommendations. A written personalized care plan for preventive services as well as general preventive health recommendations were provided to patient.     Remi Haggard, LPN   1/61/0960   After Visit Summary: (MyChart) Due to this being a telephonic visit, the after visit summary with patients personalized plan was offered to patient via MyChart   Nurse Notes:

## 2023-09-09 ENCOUNTER — Ambulatory Visit: Payer: Medicare Other | Admitting: Family Medicine

## 2023-09-20 ENCOUNTER — Other Ambulatory Visit: Payer: Self-pay

## 2023-09-20 ENCOUNTER — Encounter: Payer: Self-pay | Admitting: Family Medicine

## 2023-09-20 ENCOUNTER — Ambulatory Visit (INDEPENDENT_AMBULATORY_CARE_PROVIDER_SITE_OTHER): Payer: Medicare Other | Admitting: Family Medicine

## 2023-09-20 VITALS — BP 145/81 | HR 82 | Ht 66.0 in | Wt 164.8 lb

## 2023-09-20 DIAGNOSIS — R030 Elevated blood-pressure reading, without diagnosis of hypertension: Secondary | ICD-10-CM | POA: Diagnosis not present

## 2023-09-20 DIAGNOSIS — Z9109 Other allergy status, other than to drugs and biological substances: Secondary | ICD-10-CM | POA: Diagnosis not present

## 2023-09-20 DIAGNOSIS — J302 Other seasonal allergic rhinitis: Secondary | ICD-10-CM

## 2023-09-20 DIAGNOSIS — I10 Essential (primary) hypertension: Secondary | ICD-10-CM | POA: Diagnosis not present

## 2023-09-20 MED ORDER — AZELASTINE HCL 0.1 % NA SOLN: 1.0000 | Freq: Two times a day (BID) | NASAL | 12 refills | Status: DC

## 2023-09-20 MED ORDER — FAMOTIDINE 20 MG PO TABS: 20.0000 mg | ORAL_TABLET | Freq: Two times a day (BID) | ORAL | 0 refills | Status: DC

## 2023-09-20 MED ORDER — CETIRIZINE HCL 10 MG PO TABS: 10.0000 mg | ORAL_TABLET | Freq: Every day | ORAL | 0 refills | Status: AC

## 2023-09-20 MED ORDER — AMLODIPINE BESYLATE 5 MG PO TABS: 5.0000 mg | ORAL_TABLET | Freq: Every day | ORAL | 3 refills | Status: DC

## 2023-09-20 NOTE — Patient Instructions (Signed)
It was wonderful to see you today.  Please bring ALL of your medications with you to every visit.   Today we talked about:  Allergies - Please start taking zyrtec and pepcid for allergies. You can use the azelastine nasal spray. Use the nasal saline throughout the day to help as well. Follow up with me in about a month to discuss this.   Thank you for choosing [facility].   Please call [phone] with any questions about today's appointment.  Lockie Mola, MD  Family Medicine

## 2023-09-21 LAB — BASIC METABOLIC PANEL
BUN/Creatinine Ratio: 13 (ref 12–28)
BUN: 14 mg/dL (ref 8–27)
CO2: 21 mmol/L (ref 20–29)
Calcium: 9.2 mg/dL (ref 8.7–10.3)
Chloride: 106 mmol/L (ref 96–106)
Creatinine, Ser: 1.04 mg/dL — ABNORMAL HIGH (ref 0.57–1.00)
Glucose: 107 mg/dL — ABNORMAL HIGH (ref 70–99)
Potassium: 4.7 mmol/L (ref 3.5–5.2)
Sodium: 143 mmol/L (ref 134–144)
eGFR: 58 mL/min/{1.73_m2} — ABNORMAL LOW (ref >59)

## 2023-09-22 ENCOUNTER — Encounter: Payer: Self-pay | Admitting: Family Medicine

## 2023-09-22 DIAGNOSIS — I10 Essential (primary) hypertension: Secondary | ICD-10-CM

## 2023-09-22 HISTORY — DX: Essential (primary) hypertension: I10

## 2023-09-22 NOTE — Assessment & Plan Note (Signed)
Patient has had now multiple visits with elevated blood pressure.  Most likely primary hypertension as other labs are relatively normal.  -Will start amlodipine 5 mg daily - BMP - Follow-up in 1 month

## 2023-09-22 NOTE — Assessment & Plan Note (Signed)
Uncontrolled environmental allergies with Flonase. - Will switch to Zyrtec and Pepcid with azelastine nasal spray.  Continue encourage patient to use normal saline nasal spray throughout the day if needed. - But did will take the place of patient's Prilosec for GERD has Pepcid may also be able to be used as an add-on therapy for her environmental allergies.

## 2023-09-22 NOTE — Progress Notes (Signed)
    SUBJECTIVE:   CHIEF COMPLAINT / HPI:   Blood Pressure Follow Up  Patient elevated blood pressure at last appointment that was scheduled for follow-up to recheck blood pressure.  Has not been taking any allergy medications that would increase blood pressure such as Sudafed.  Does not have any headache vision changes abdominal pain.  Allergies  Patient says that she had been taking Claritin for decades and this worked for her environmental allergies however that is no longer working for her.  She tried Flonase but does not know if it is having any effect on her nasal congestion from allergies.  Her symptoms included itchy eyes, rhinorrhea, itchy throat. Patient reports that she had side effects when taking allegra - had a sore throat and went to the hospital. Did not have trouble breathing or swelling.   PERTINENT  PMH / PSH: GERD, Environmental allergies, Elevated blood pressure reading   OBJECTIVE:   BP (!) 145/81   Pulse 82   Ht 5\' 6"  (1.676 m)   Wt 164 lb 12.8 oz (74.8 kg)   SpO2 100%   BMI 26.60 kg/m   General: well appearing, in no acute distress HEENT: swollen and erythematous nares, minimal conjunctival injection CV: RRR, radial pulses equal and palpable, no BLE edema  Resp: Normal work of breathing on room air, CTAB   ASSESSMENT/PLAN:   Assessment & Plan Primary hypertension Patient has had now multiple visits with elevated blood pressure.  Most likely primary hypertension as other labs are relatively normal.  -Will start amlodipine 5 mg daily - BMP - Follow-up in 1 month Environmental allergies Uncontrolled environmental allergies with Flonase. - Will switch to Zyrtec and Pepcid with azelastine nasal spray.  Continue encourage patient to use normal saline nasal spray throughout the day if needed. - But did will take the place of patient's Prilosec for GERD has Pepcid may also be able to be used as an add-on therapy for her environmental allergies.      Lockie Mola, MD [facility]

## 2023-10-10 ENCOUNTER — Ambulatory Visit: Payer: Self-pay | Admitting: Family Medicine

## 2023-10-20 ENCOUNTER — Encounter: Payer: Self-pay | Admitting: Family Medicine

## 2023-10-26 ENCOUNTER — Ambulatory Visit: Payer: Medicare Other | Admitting: Family Medicine

## 2023-11-01 ENCOUNTER — Ambulatory Visit: Payer: Medicare Other | Admitting: Family Medicine

## 2023-12-29 ENCOUNTER — Ambulatory Visit
Admission: EM | Admit: 2023-12-29 | Discharge: 2023-12-29 | Disposition: A | Discharge status: Home / Self Care | Payer: Medicare Other | Attending: Family Medicine | Admitting: Family Medicine

## 2023-12-29 ENCOUNTER — Ambulatory Visit (INDEPENDENT_AMBULATORY_CARE_PROVIDER_SITE_OTHER): Payer: Medicare Other

## 2023-12-29 DIAGNOSIS — R053 Chronic cough: Secondary | ICD-10-CM

## 2023-12-29 MED ORDER — AZITHROMYCIN 250 MG PO TABS: 250.0000 mg | ORAL_TABLET | Freq: Every day | ORAL | 0 refills | Status: DC

## 2023-12-29 MED ORDER — HYDROCODONE BIT-HOMATROP MBR 5-1.5 MG/5ML PO SOLN: 2.5000 mL | Freq: Four times a day (QID) | ORAL | 0 refills | Status: DC | PRN

## 2023-12-29 NOTE — ED Provider Notes (Signed)
[facility]   [id number] 12/29/23 Arrival Time: 0900  ASSESSMENT & PLAN:  1. Persistent cough for 3 weeks or longer    I have personally viewed and independently interpreted the imaging studies ordered this visit. CXR: no acute changes appreciated.  Given duration of symptoms will treat as below: Meds ordered this encounter  Medications   azithromycin (ZITHROMAX) 250 MG tablet    Sig: Take 1 tablet (250 mg total) by mouth daily. Take first 2 tablets together, then 1 every day until finished.    Dispense:  6 tablet    Refill:  0   HYDROcodone bit-homatropine (HYCODAN) 5-1.5 MG/5ML syrup    Sig: Take 2.5-5 mLs by mouth every 6 (six) hours as needed for cough.    Dispense:  60 mL    Refill:  0     Follow-up Information     Lockie Mola, MD.   Specialty: Family Medicine Why: If worsening or failing to improve as anticipated. Contact information: [address] [place] [address] [phone]                 Reviewed expectations re: course of current medical issues. Questions answered. Outlined signs and symptoms indicating need for more acute intervention. Understanding verbalized. After Visit Summary given.   SUBJECTIVE: History from: Patient. Morgan Moreno is a 68 y.o. female. Pt presents with c/o ongoing cough, sinus pressure X 3 wks. Pt states she took Robitussin, has not helped.  States she took an at home COVID test that was negative.  Denies: difficulty breathing and wheezing. Normal PO intake without n/v/d.  OBJECTIVE:  Vitals:   12/29/23 0932  BP: (!) 152/80  Pulse: 83  Resp: 20  Temp: 98.4 F (36.9 C)  TempSrc: Oral  SpO2: 96%    General appearance: alert; no distress Eyes: PERRLA; EOMI; conjunctiva normal HENT: [place]; AT; with nasal congestion Neck: supple  Lungs: speaks full sentences without difficulty; unlabored; CTAB Extremities: no edema Skin: warm and dry Neurologic: normal gait Psychological: alert and  cooperative; normal mood and affect   Allergies  Allergen Reactions   Sulfonamide Derivatives     REACTION: itch/rash   Oxycodone-Acetaminophen Itching and Rash    Past Medical History:  Diagnosis Date   Allergy    Anemia    "a long time ago," 13+ years ago as of 2014   Arthritis    Goiter 01/26/2007   Qualifier: Diagnosis of  By: Seleta Rhymes MD, Mark     Primary hypertension 09/22/2023   Sickle cell anemia (HCC)    ?trait   Social History   Socioeconomic History   Marital status: Single    Spouse name: Not on file   Number of children: Not on file   Years of education: Not on file   Highest education level: Not on file  Occupational History   Not on file  Tobacco Use   Smoking status: Never   Smokeless tobacco: Never  Substance and Sexual Activity   Alcohol use: Yes    Comment: "maybe two drinks a year"   Drug use: No   Sexual activity: Never  Other Topics Concern   Not on file  Social History Narrative   Not on file   Social Drivers of Health   Financial Resource Strain: Low Risk  (08/23/2023)   Overall Financial Resource Strain (CARDIA)    Difficulty of Paying Living Expenses: Not hard at all  Food Insecurity: No Food Insecurity (08/23/2023)   Hunger Vital Sign  Worried About Programme researcher, broadcasting/film/video in the Last Year: Never true    Ran Out of Food in the Last Year: Never true  Transportation Needs: No Transportation Needs (08/23/2023)   PRAPARE - Administrator, Civil Service (Medical): No    Lack of Transportation (Non-Medical): No  Physical Activity: Insufficiently Active (08/23/2023)   Exercise Vital Sign    Days of Exercise per Week: 3 days    Minutes of Exercise per Session: 30 min  Stress: No Stress Concern Present (08/23/2023)   [employer] of Occupational Health - Occupational Stress Questionnaire    Feeling of Stress : Not at all  Social Connections: Unknown (08/23/2023)   Social Connection and Isolation Panel [NHANES]    Frequency of  Communication with Friends and Family: More than three times a week    Frequency of Social Gatherings with Friends and Family: More than three times a week    Attends Religious Services: More than 4 times per year    Active Member of [employer] or Organizations: Yes    Attends Banker Meetings: More than 4 times per year    Marital Status: Not on file  Intimate Partner Violence: Not At Risk (08/23/2023)   Humiliation, Afraid, Rape, and Kick questionnaire    Fear of Current or Ex-Partner: No    Emotionally Abused: No    Physically Abused: No    Sexually Abused: No   Family History  Problem Relation Age of Onset   Kidney disease Mother    Diabetes Mother    Hypertension Mother    Stroke Mother    Mental illness Father        schizophrenic   Asthma Sister    Asthma Brother    Cancer Paternal Uncle    Past Surgical History:  Procedure Laterality Date   ABDOMINAL HYSTERECTOMY     At age 54, for fibroids/bleeding   CATARACT EXTRACTION Left 02/05/14   Removed by Dr. Elmer Picker in [place]   FOOT SURGERY Right    "Metatarsal bone dropped"   HAND SURGERY Right    "Replaced cartilage"     Mardella Layman, MD 12/29/23 1113

## 2023-12-29 NOTE — ED Triage Notes (Signed)
Pt presents with c/o ongoing cough, sinus pressure X 3 wks. Pt states she took Robitussin, has not helped.  States she took an at home COVID test that was negative.

## 2024-08-27 ENCOUNTER — Ambulatory Visit: Payer: Medicare Other

## 2024-08-27 VITALS — Ht 66.0 in | Wt 164.0 lb

## 2024-08-27 DIAGNOSIS — Z0000 Encounter for general adult medical examination without abnormal findings: Secondary | ICD-10-CM | POA: Diagnosis not present

## 2024-08-27 NOTE — Progress Notes (Signed)
 Subjective:   Morgan Moreno is a 68 y.o. who presents for a Medicare Wellness preventive visit.  As a reminder, Annual Wellness Visits don't include a physical exam, and some assessments may be limited, especially if this visit is performed virtually. We may recommend an in-person follow-up visit with your provider if needed.  Visit Complete: Virtual I connected with  Morgan Moreno on 08/27/24 by a video and audio enabled telemedicine application and verified that I am speaking with the correct person using two identifiers.  Patient Location: Home  Provider Location: Home Office  I discussed the limitations of evaluation and management by telemedicine. The patient expressed understanding and agreed to proceed.  Vital Signs: Because this visit was a virtual/telehealth visit, some criteria may be missing or patient reported. Any vitals not documented were not able to be obtained and vitals that have been documented are patient reported.  Persons Participating in Visit: Patient.  AWV Questionnaire: No: Patient Medicare AWV questionnaire was not completed prior to this visit.  Cardiac Risk Factors include: advanced age (>37men, >61 women);hypertension     Objective:    Today's Vitals   08/27/24 1132  Weight: 164 lb (74.4 kg)  Height: 5' 6 (1.676 m)   Body mass index is 26.47 kg/m.     08/27/2024   11:35 AM 08/23/2023    9:08 AM 06/22/2023    8:38 AM 06/18/2022    3:07 PM 03/04/2017    2:09 PM 02/11/2017    1:50 PM  Advanced Directives  Does Patient Have a Medical Advance Directive? No No No No No  No   Would patient like information on creating a medical advance directive? Yes (MAU/Ambulatory/Procedural Areas - Information given) No - Patient declined   No - Patient declined  No - Patient declined      Data saved with a previous flowsheet row definition    Current Medications (verified) Outpatient Encounter Medications as of 08/27/2024  Medication Sig    amLODipine  (NORVASC ) 5 MG tablet Take 1 tablet (5 mg total) by mouth at bedtime.   cetirizine  (ZYRTEC  ALLERGY) 10 MG tablet Take 1 tablet (10 mg total) by mouth daily.   naproxen sodium (ANAPROX) 220 MG tablet Take 220 mg by mouth 2 (two) times daily as needed (pain).   omeprazole (PRILOSEC) 20 MG capsule Take 20 mg by mouth daily.     Polyethyl Glycol-Propyl Glycol (SYSTANE OP) Apply 2 drops to eye 3 (three) times daily as needed (allergy relief).   azelastine  (ASTELIN ) 0.1 % nasal spray Place 1 spray into both nostrils 2 (two) times daily. Use in each nostril as directed (Patient not taking: Reported on 08/27/2024)   azithromycin  (ZITHROMAX ) 250 MG tablet Take 1 tablet (250 mg total) by mouth daily. Take first 2 tablets together, then 1 every day until finished. (Patient not taking: Reported on 08/27/2024)   famotidine  (PEPCID ) 20 MG tablet Take 1 tablet (20 mg total) by mouth 2 (two) times daily. (Patient not taking: Reported on 08/27/2024)   HYDROcodone  bit-homatropine (HYCODAN) 5-1.5 MG/5ML syrup Take 2.5-5 mLs by mouth every 6 (six) hours as needed for cough. (Patient not taking: Reported on 08/27/2024)   No facility-administered encounter medications on file as of 08/27/2024.    Allergies (verified) Sulfonamide derivatives and Oxycodone-acetaminophen   History: Past Medical History:  Diagnosis Date   Allergy    Anemia    a long time ago, 13+ years ago as of 2014   Arthritis    Goiter 01/26/2007  Qualifier: Diagnosis of  By: Xavier MD, Mark     Primary hypertension 09/22/2023   Sickle cell anemia (HCC)    ?trait   Past Surgical History:  Procedure Laterality Date   ABDOMINAL HYSTERECTOMY     At age 6, for fibroids/bleeding   CATARACT EXTRACTION Left 02/05/14   Removed by Dr. Cleatus in [place]   FOOT SURGERY Right    Metatarsal bone dropped   HAND SURGERY Right    Replaced cartilage   Family History  Problem Relation Age of Onset   Kidney disease Mother    Diabetes  Mother    Hypertension Mother    Stroke Mother    Mental illness Father        schizophrenic   Asthma Sister    Asthma Brother    Cancer Paternal Uncle    Social History   Socioeconomic History   Marital status: Single    Spouse name: Not on file   Number of children: Not on file   Years of education: Not on file   Highest education level: Not on file  Occupational History   Not on file  Tobacco Use   Smoking status: Never   Smokeless tobacco: Never  Substance and Sexual Activity   Alcohol use: Yes    Comment: maybe two drinks a year   Drug use: No   Sexual activity: Never  Other Topics Concern   Not on file  Social History Narrative   Not on file   Social Drivers of Health   Financial Resource Strain: Low Risk  (08/23/2023)   Overall Financial Resource Strain (CARDIA)    Difficulty of Paying Living Expenses: Not hard at all  Food Insecurity: No Food Insecurity (08/27/2024)   Hunger Vital Sign    Worried About Running Out of Food in the Last Year: Never true    Ran Out of Food in the Last Year: Never true  Transportation Needs: No Transportation Needs (08/23/2023)   PRAPARE - Administrator, Civil Service (Medical): No    Lack of Transportation (Non-Medical): No  Physical Activity: Insufficiently Active (08/27/2024)   Exercise Vital Sign    Days of Exercise per Week: 3 days    Minutes of Exercise per Session: 30 min  Stress: No Stress Concern Present (08/27/2024)   [employer] of Occupational Health - Occupational Stress Questionnaire    Feeling of Stress: Not at all  Social Connections: Moderately Integrated (08/27/2024)   Social Connection and Isolation Panel    Frequency of Communication with Friends and Family: More than three times a week    Frequency of Social Gatherings with Friends and Family: More than three times a week    Attends Religious Services: More than 4 times per year    Active Member of [employer] or Organizations: Yes    Attends  Engineer, structural: More than 4 times per year    Marital Status: Never married    Tobacco Counseling Counseling given: Not Answered    Clinical Intake:  Pre-visit preparation completed: Yes  Pain : No/denies pain  Diabetes: No  Lab Results  Component Value Date   HGBA1C 5.7 06/18/2022     How often do you need to have someone help you when you read instructions, pamphlets, or other written materials from your doctor or pharmacy?: 1 - Never  Interpreter Needed?: No  Information entered by :: Charmaine Bloodgood LPN   Activities of Daily Living     08/27/2024  11:35 AM  In your present state of health, do you have any difficulty performing the following activities:  Hearing? 0  Vision? 0  Difficulty concentrating or making decisions? 0  Walking or climbing stairs? 0  Dressing or bathing? 0  Doing errands, shopping? 0  Preparing Food and eating ? N  Using the Toilet? N  In the past six months, have you accidently leaked urine? N  Do you have problems with loss of bowel control? N  Managing your Medications? N  Managing your Finances? N  Housekeeping or managing your Housekeeping? N    Patient Care Team: Nicholas Bar, MD as PCP - General (Family Medicine)  I have updated your Care Teams any recent Medical Services you may have received from other providers in the past year.     Assessment:   This is a routine wellness examination for Morgan Moreno.  Hearing/Vision screen Hearing Screening - Comments:: Denies hearing difficulties   Vision Screening - Comments:: No vision problems; will schedule routine eye exam     Goals Addressed             This Visit's Progress    Maintain health and independence   On track      Depression Screen     08/27/2024   11:34 AM 09/20/2023    3:12 PM 08/23/2023    9:12 AM 06/18/2022    3:08 PM 03/04/2017    2:09 PM  PHQ 2/9 Scores  PHQ - 2 Score 0 0 0 0 0  PHQ- 9 Score  2 2 2  0    Fall Risk     08/27/2024    11:35 AM 09/20/2023    3:12 PM 08/23/2023    9:05 AM 06/18/2022    3:07 PM  Fall Risk   Falls in the past year? 0 0 1 0  Number falls in past yr: 0 0 0   Injury with Fall? 0 0    Risk for fall due to : No Fall Risks     Follow up Falls prevention discussed;Education provided;Falls evaluation completed  Falls evaluation completed;Education provided;Falls prevention discussed     MEDICARE RISK AT HOME:  Medicare Risk at Home Any stairs in or around the home?: No If so, are there any without handrails?: No Home free of loose throw rugs in walkways, pet beds, electrical cords, etc?: Yes Adequate lighting in your home to reduce risk of falls?: Yes Life alert?: No Use of a cane, walker or w/c?: No Grab bars in the bathroom?: Yes Shower chair or bench in shower?: No Elevated toilet seat or a handicapped toilet?: No  TIMED UP AND GO:  Was the test performed?  No  Cognitive Function: 6CIT completed        08/27/2024   11:35 AM 08/23/2023    9:10 AM  6CIT Screen  What Year? 0 points 0 points  What month? 0 points 0 points  What time? 0 points 0 points  Count back from 20 0 points 0 points  Months in reverse 0 points 0 points  Repeat phrase 0 points 0 points  Total Score 0 points 0 points    Immunizations Immunization History  Administered Date(s) Administered   Td 02/27/1998   Tdap 02/23/2013    Screening Tests Health Maintenance  Topic Date Due   COVID-19 Vaccine (1) Never done   Pneumococcal Vaccine: 50+ Years (1 of 2 - PCV) Never done   Zoster Vaccines- Shingrix (1 of 2) Never done  Mammogram  12/28/2008   DEXA SCAN  Never done   Influenza Vaccine  Never done   Medicare Annual Wellness (AWV)  08/27/2025   Fecal DNA (Cologuard)  09/19/2026   Hepatitis C Screening  Completed   HPV VACCINES  Aged Out   Meningococcal B Vaccine  Aged Out   DTaP/Tdap/Td  Discontinued    Health Maintenance Items Addressed: Information provided on mammogram and dexa as well as  recommended vaccines   Additional Screening:  Vision Screening: Recommended annual ophthalmology exams for early detection of glaucoma and other disorders of the eye. Is the patient up to date with their annual eye exam?  No  Who is the provider or what is the name of the office in which the patient attends annual eye exams? List of area providers sent to patient   Dental Screening: Recommended annual dental exams for proper oral hygiene  Community Resource Referral / Chronic Care Management: CRR required this visit?  No   CCM required this visit?  No   Plan:    I have personally reviewed and noted the following in the patient's chart:   Medical and social history Use of alcohol, tobacco or illicit drugs  Current medications and supplements including opioid prescriptions. Patient is not currently taking opioid prescriptions. Functional ability and status Nutritional status Physical activity Advanced directives List of other physicians Hospitalizations, surgeries, and ER visits in previous 12 months Vitals Screenings to include cognitive, depression, and falls Referrals and appointments  In addition, I have reviewed and discussed with patient certain preventive protocols, quality metrics, and best practice recommendations. A written personalized care plan for preventive services as well as general preventive health recommendations were provided to patient.   Morgan Moreno [place], [place]   [id number]   After Visit Summary: (MyChart) Due to this being a telephonic visit, the after visit summary with patients personalized plan was offered to patient via MyChart   Notes: Nothing significant to report at this time.

## 2024-08-27 NOTE — Patient Instructions (Signed)
 Morgan Moreno,  Thank you for taking the time for your Medicare Wellness Visit. I appreciate your continued commitment to your health goals. Please review the care plan we discussed, and feel free to reach out if I can assist you further.  Medicare recommends these wellness visits once per year to help you and your care team stay ahead of potential health issues. These visits are designed to focus on prevention, allowing your provider to concentrate on managing your acute and chronic conditions during your regular appointments.  Please note that Annual Wellness Visits do not include a physical exam. Some assessments may be limited, especially if the visit was conducted virtually. If needed, we may recommend a separate in-person follow-up with your provider.  Ongoing Care Seeing your primary care provider every 3 to 6 months helps us  monitor your health and provide consistent, personalized care.   Referrals If a referral was made during today's visit and you haven't received any updates within two weeks, please contact the referred provider directly to check on the status.  There are several Eye Doctors in your area. Here are a few that usually accept all insurance types:  [facility] [address] [place], [place] [address] Phone: [phone]  [facility] [address] [place], [place] [address] Phone: [phone]  [facility] [address] [place], [place] [address] Phone: [phone]  [facility] [address] [facility] [place], [place] [address] Phone: [phone]  [facility] [address] [place], [place] [address] Phone: [phone]  Recommended Screenings:  Health Maintenance  Topic Date Due   COVID-19 Vaccine (1) Never done   Pneumococcal Vaccine for age over 62 (1 of 2 - PCV) Never done   Zoster (Shingles) Vaccine (1 of 2) Never done   Breast Cancer Screening  12/28/2008   DEXA scan (bone density measurement)   Never done   Flu Shot  Never done   Medicare Annual Wellness Visit  08/27/2025   Cologuard (Stool DNA test)  09/19/2026   Hepatitis C Screening  Completed   HPV Vaccine  Aged Out   Meningitis B Vaccine  Aged Out   DTaP/Tdap/Td vaccine  Discontinued       08/27/2024   11:35 AM  Advanced Directives  Does Patient Have a Medical Advance Directive? No  Would patient like information on creating a medical advance directive? Yes (MAU/Ambulatory/Procedural Areas - Information given)   Advance Care Planning is important because it: Ensures you receive medical care that aligns with your values, goals, and preferences. Provides guidance to your family and loved ones, reducing the emotional burden of decision-making during critical moments.  Information on Advanced Care Planning can be found at [place]  Secretary of [facility] Advance Health Care Directives Advance Health Care Directives ([email or web address]   Vision: Annual vision screenings are recommended for early detection of glaucoma, cataracts, and diabetic retinopathy. These exams can also reveal signs of chronic conditions such as diabetes and high blood pressure.  Dental: Annual dental screenings help detect early signs of oral cancer, gum disease, and other conditions linked to overall health, including heart disease and diabetes.  Please see the attached documents for additional preventive care recommendations.

## 2024-08-29 NOTE — Progress Notes (Signed)
 Morgan Moreno                                          MRN: [id number]   08/29/2024   The [facility]I Quality Team Specialist reviewed this patient medical record for the purposes of chart review for care gap closure. The following were reviewed: chart review for care gap closure-breast cancer screening and colorectal cancer screening.    VBCI Quality Team

## 2024-09-18 ENCOUNTER — Other Ambulatory Visit: Payer: Self-pay | Admitting: Family Medicine

## 2024-09-18 ENCOUNTER — Encounter: Payer: Self-pay | Admitting: Family Medicine

## 2024-09-18 ENCOUNTER — Ambulatory Visit: Admitting: Family Medicine

## 2024-09-18 VITALS — BP 146/80 | HR 68 | Ht 66.0 in | Wt 160.0 lb

## 2024-09-18 DIAGNOSIS — K219 Gastro-esophageal reflux disease without esophagitis: Secondary | ICD-10-CM

## 2024-09-18 DIAGNOSIS — G4709 Other insomnia: Secondary | ICD-10-CM

## 2024-09-18 DIAGNOSIS — Z9109 Other allergy status, other than to drugs and biological substances: Secondary | ICD-10-CM

## 2024-09-18 DIAGNOSIS — I10 Essential (primary) hypertension: Secondary | ICD-10-CM | POA: Diagnosis not present

## 2024-09-18 MED ORDER — PANTOPRAZOLE SODIUM 40 MG PO TBEC: 40.0000 mg | DELAYED_RELEASE_TABLET | Freq: Every day | ORAL | 0 refills | Status: AC

## 2024-09-18 MED ORDER — AMLODIPINE-OLMESARTAN 5-20 MG PO TABS: 1.0000 | ORAL_TABLET | Freq: Every day | ORAL | 0 refills | Status: AC

## 2024-09-18 NOTE — Progress Notes (Signed)
   SUBJECTIVE:   CHIEF COMPLAINT / HPI:  Discussed the use of AI scribe software for clinical note transcription with the patient, who gave verbal consent to proceed.  History of Present Illness Morgan Moreno is a 68 year old female with hypertension who presents for follow-up of blood pressure and worsening reflux and allergies.  Gastroesophageal reflux symptoms - Worsening reflux symptoms despite Prilosec (omeprazole) once daily - Occasionally requires two doses of Prilosec for symptom control - Experiences burning sensations - Adjusts eating posture to alleviate symptoms - Prilosec taken as needed due to dislike of pills - Pepcid  (famotidine ) was ineffective - Protonix (pantoprazole) was effective but not covered by insurance  Allergic rhinitis and associated symptoms - Worsening allergy symptoms - Claritin (loratadine) no longer effective - Switched to Zyrtec  (cetirizine ) and uses a nasal spray, which helps with nasal symptoms but not with runny nose, drippy eyes, or ear congestion - Flonase (fluticasone) provided no improvement - Allergy symptoms are now year-round and daily  Hypertension management - Takes Norvasc  (amlodipine ) for hypertension, started last year - No side effects such as headaches or leg swelling - Not taking azithromycin     PERTINENT  PMH / PSH: Allergic rhinitis, GERD  OBJECTIVE:  BP (!) 140/80   Pulse 68   Ht 5' 6 (1.676 m)   Wt 160 lb (72.6 kg)   SpO2 98%   BMI 25.82 kg/m   Physical Exam   General: well appearing, in no acute distress CV: RRR, radial pulses equal and palpable, no BLE edema  Resp: Normal work of breathing on room air, CTAB Abd: Soft, non tender, non distended  Neuro: Alert & Oriented x 4   ASSESSMENT/PLAN:   Assessment & Plan Environmental allergies  Gastroesophageal reflux disease without esophagitis   Assessment and Plan Assessment & Plan Primary Hypertension Blood pressure remains elevated without side  effects from amlodipine . - Measure blood pressure again before the end of the visit.  Gastroesophageal Reflux Disease (GERD) Chronic GERD with inadequate response to Prilosec. Protonix was effective but discontinued due to insurance. Considering switching to Protonix for better management. - Prescribe Protonix at a higher dose for three months.  Chronic Allergic Rhinitis Chronic allergic rhinitis with ineffective oral antihistamines. Symptoms are year-round. - Refer to an allergy specialist for further evaluation and management, including potential allergy injections.  Insomnia Chronic insomnia without daytime sleepiness or mood disorders. - Recommend light exercise for 15-30 minutes in the evening to improve sleep quality.     Areta Saliva, MD [facility]

## 2024-09-18 NOTE — Patient Instructions (Addendum)
 It was wonderful to see you today.  Please bring ALL of your medications with you to every visit.   Today we talked about:  Reflux - I have prescribed pantoprazole (protonix) try this and see if it helps your symptoms. I would recommend testing for H pylori. For this you would have to be off of your reflux medication for 2 weeks for the test.   For your allergies - I have referred you to an allergy specialist since your symptoms have not improved with many medications.   For your blood pressure we are starting a combo medication with olmesartan and amlodipine .   For insomnia - try exercise 1-2 hours before bed and see if this helps.   Please follow up in 2 weeks for labs and BP fu   Thank you for choosing [facility].   Please call [phone] with any questions about today's appointment.  Please be sure to schedule follow up at the front desk before you leave today.   Areta Saliva, MD  Family Medicine

## 2024-09-20 NOTE — Assessment & Plan Note (Signed)
 Blood pressure remains elevated without side effects from amlodipine . - Start combo pill with olmesartan 20 and amlodipine  5 - Follow-up for blood pressure check and BMP in 2 weeks.

## 2024-09-20 NOTE — Assessment & Plan Note (Signed)
 Patient has altered her lifestyle significantly and still has significant symptoms from GERD.  Could be due to not on daily use of omeprazole.  Patient has not been tested for H. pylori. - Patient declines testing for H. pylori at this time due to having to be off of PPIs for 2 weeks. - Trial of Protonix given it was effective in the past compared to Prilosec. - No weight loss at this time but if patient continues to have significant symptoms we will revisit H. pylori testing

## 2024-10-01 NOTE — Progress Notes (Signed)
 Morgan Moreno                                          MRN: [id number]   10/01/2024   The [facility] Quality Team Specialist reviewed this patient medical record for the purposes of chart review for care gap closure. The following were reviewed: chart review for care gap closure-breast cancer screening.    [facility] Quality Team

## 2024-10-01 NOTE — Progress Notes (Signed)
 Aleathia Purdy                                          MRN: [id number]   10/01/2024   The [facility] Quality Team Specialist reviewed this patient medical record for the purposes of chart review for care gap closure. The following were reviewed: chart review for care gap closure-colorectal cancer screening.    [facility] Quality Team

## 2024-10-08 ENCOUNTER — Ambulatory Visit: Admitting: Family Medicine

## 2024-12-25 ENCOUNTER — Ambulatory Visit: Admitting: Orthopedic Surgery

## 2024-12-27 ENCOUNTER — Ambulatory Visit: Admitting: Allergy & Immunology

## 2025-01-02 ENCOUNTER — Encounter: Payer: Self-pay | Admitting: Orthopedic Surgery

## 2025-01-02 ENCOUNTER — Ambulatory Visit: Admitting: Orthopedic Surgery

## 2025-01-02 ENCOUNTER — Other Ambulatory Visit: Payer: Self-pay

## 2025-01-02 VITALS — BP 149/82 | HR 90 | Ht 66.0 in | Wt 154.0 lb

## 2025-01-02 DIAGNOSIS — M25512 Pain in left shoulder: Secondary | ICD-10-CM

## 2025-01-02 DIAGNOSIS — M65312 Trigger thumb, left thumb: Secondary | ICD-10-CM

## 2025-01-02 DIAGNOSIS — M79645 Pain in left finger(s): Secondary | ICD-10-CM

## 2025-01-02 NOTE — Progress Notes (Signed)
 New Patient Visit  Summary: Morgan Moreno is a 69 y.o. female with the following: 1. Left shoulder pain, unspecified chronicity 2. Trigger thumb, left thumb  Assessment & Plan Left shoulder pain Mild, non-acute pain likely due to subacromial bursitis or tendon irritation. No significant injury or adhesive capsulitis. Prognosis benign with conservative management. - Reassured about absence of significant injury or adhesive capsulitis. - Recommended naproxen for 10 days. - Discussed corticosteroid injection if pain persists or worsens. - Provided anticipatory guidance on benign findings and expected course. - Supplied written information about medication use.  Trigger finger of the left thumb Intermittent pain and catching consistent with early trigger finger. Mild condition with favorable prognosis under conservative management. - Educated on diagnosis and pathophysiology. - Discussed management: nocturnal thumb brace, NSAIDs, corticosteroid injection if symptoms persist or worsen. - Provided written information about trigger finger and medication use. - Advised to contact clinic if symptoms worsen or for injection.     Follow-up: Return if symptoms worsen or fail to improve.  Subjective:  Chief Complaint  Patient presents with   Shoulder Pain    L for approx 6 wks, pt states she did have frozen shoulder in this same shoulder yrs ago and it took a yr to get through it.    Hand Pain    L thumb feels like it pops out of place and she has to pull it to get it back in place     Discussed the use of AI scribe software for clinical note transcription with the patient, who gave verbal consent to proceed.  History of Present Illness Morgan Moreno is a 69 year old right hand dominant female with remote left frozen shoulder who presents with left shoulder and left thumb pain.  For 2 months she has had intermittent left shoulder pain involving anterior and posterior  aspects without clear inciting event or trauma. Pain worsens with certain positions, especially lying on the left side, and disrupts sleep. She is cautious with shoulder use due to a prior frozen shoulder that took over a year to resolve. She takes Aleve intermittently and has not tried physical therapy or injections for this episode.  She also has intermittent left thumb pain at the base and another area of the thumb. She notes a sensation of subluxation relieved by manual traction with an associated popping. She has occasional triggering when straightening the thumb, particularly in the morning and related to sleep position. She uses a thumb brace at night and takes Aleve as needed. She denies thumb trauma or injury.    Review of Systems: No fevers or chills No numbness or tingling No chest pain No shortness of breath No bowel or bladder dysfunction No GI distress No headaches   Medical History:  Past Medical History:  Diagnosis Date   Allergy    Anemia    a long time ago, 13+ years ago as of 2014   Arthritis    Goiter 01/26/2007   Qualifier: Diagnosis of  By: Xavier MD, Osman Calzadilla     Primary hypertension 09/22/2023   Sickle cell anemia (HCC)    ?trait    Past Surgical History:  Procedure Laterality Date   ABDOMINAL HYSTERECTOMY     At age 28, for fibroids/bleeding   CATARACT EXTRACTION Left 02/05/14   Removed by Dr. Cleatus in [place]   FOOT SURGERY Right    Metatarsal bone dropped   HAND SURGERY Right    Replaced cartilage    Family History  Problem Relation Age of Onset   Kidney disease Mother    Diabetes Mother    Hypertension Mother    Stroke Mother    Mental illness Father        schizophrenic   Asthma Sister    Asthma Brother    Cancer Paternal Uncle    Social History[1]  Allergies[2]  Active Medications[3]  Objective: BP (!) 149/82   Pulse 90   Ht 5' 6 (1.676 m)   Wt 154 lb (69.9 kg)   BMI 24.86 kg/m   Physical Exam:    General:  Alert and oriented. and No acute distress. Gait: Normal gait.  Physical Exam MUSCULOSKELETAL: Shoulder with good range of motion and strength.  Negative Jobes.  Negative drop arm test.  Negative O'Brien's.  Tenderness over the bicipital groove.  Evaluation of left thumb demonstrates no significant abnormality.  No redness.  No swelling.  Tenderness palpation over the A1 pulley to the thumb.  No active triggering noted in clinic today.   IMAGING: I personally ordered and reviewed the following images  X-rays of the left shoulder were obtained in clinic today.  No acute injuries are noted.  Mild loss of glenohumeral joint space.  No significant osteophytes.  No evidence of proximal humeral migration.  No bony lesions.  No reactive bone.  No evidence of a chronic injury.  Impression: Negative left shoulder x-ray   X-rays of the left thumb were obtained in clinic today.  These are compared to available x-rays.  No acute injury noted.  There are degenerative changes noted at the first [facility] joint.  No dislocation.  No bony lesions.  Impression: Negative left thumb x-ray     New Medications:  No orders of the defined types were placed in this encounter.     Portions of this note were completed via Scientist, clinical (histocompatibility and immunogenetics).  Oneil DELENA Horde, MD  01/02/2025 4:02 PM      [1]  Social History Tobacco Use   Smoking status: Never   Smokeless tobacco: Never  Substance Use Topics   Alcohol use: Yes    Comment: maybe two drinks a year   Drug use: No  [2]  Allergies Allergen Reactions   Sulfonamide Derivatives     REACTION: itch/rash   Oxycodone-Acetaminophen Itching and Rash  [3]  Current Meds  Medication Sig   amLODipine -olmesartan  (AZOR ) 5-20 MG tablet Take 1 tablet by mouth daily.   cetirizine  (ZYRTEC  ALLERGY) 10 MG tablet Take 1 tablet (10 mg total) by mouth daily.   naproxen sodium (ANAPROX) 220 MG tablet Take 220 mg by mouth 2 (two) times daily as needed (pain).    pantoprazole  (PROTONIX ) 40 MG tablet Take 1 tablet (40 mg total) by mouth daily.   Polyethyl Glycol-Propyl Glycol (SYSTANE OP) Apply 2 drops to eye 3 (three) times daily as needed (allergy relief).

## 2025-01-02 NOTE — Patient Instructions (Signed)
 We have discussed taking over the counter medications for your pain.  Below are some common medicines to consider.  Also listed are the recommended doses and how to take these medications as a prescription strength medicine.  If you experience any issues or side effects, please discontinue the medicine and contact the office for some assistance.   Tylenol or acetaminophen - can be used to help treat minor pains and fevers or chills.  Common doses include 350 mg, 500 mg and 650 mg.  Following surgery, I recommend taking 1000 mg of this medication three times a day.  Some narcotic pain medications contain acetaminophen, so you cannot take additional acetaminophen. This medicine can be bad for your liver.  DO NOT TAKE MORE THAN 4000 mg per day.  Advil, Motrin or Ibuprofen - anti-inflammatory medicines.  Can be used to treat chronic pain and help with swelling.  Common over the counter dose includes 200 mg.  Bottle states you can take 1-2 pills.  Common prescription doses include 600 mg or 800 mg pills.  This medication can be hard on your stomach and your kidneys.  If you have a history of stomach ulcers or kidney issues, you should discuss this with your PCP.  This medicine can be taken 3 times per day.  You should take this medication with food in your stomach.   Aleve or Naproxen - anti inflammatory medicines.  Can be used to treat chronic pain and help with swelling. Common over the counter dose is 220 mg.  The bottle states you can take 1 pill, twice a day.   Typical prescription dose is 500 mg two times a day.  This medication can be hard on your stomach and your kidneys.  If you have a history of stomach ulcers or kidney issues, you should discuss this with your PCP.  This medicine can be taken 2 times per day.  You should take this medication with food in your stomach.    Acetaminopen and ibuprofen/naproxen can be taking at the same time, but please do not take ibuprofen and naproxen at the same time.   These medications work the same way and can cause further injury to your stomach and/or kidneys.    Patients taking a blood thinner are often encouraged not to take NSAIDs or medications like ibuprofen or naproxen because they increase the possibility of internal bleeding.  If you have any questions, you should contact the doctor who recommended these medicines.

## 2025-01-03 ENCOUNTER — Other Ambulatory Visit: Payer: Self-pay

## 2025-01-03 DIAGNOSIS — Z1231 Encounter for screening mammogram for malignant neoplasm of breast: Secondary | ICD-10-CM

## 2025-01-08 ENCOUNTER — Ambulatory Visit: Admitting: Allergy

## 2025-01-11 ENCOUNTER — Ambulatory Visit: Admitting: Sports Medicine

## 2025-01-14 ENCOUNTER — Ambulatory Visit: Admitting: Allergy

## 2025-03-08 ENCOUNTER — Ambulatory Visit
